# Patient Record
Sex: Female | Born: 1985 | Race: White | Hispanic: No | Marital: Single | Smoking: Current every day smoker
Health system: Southern US, Community
[De-identification: ages and names within clinical notes are randomized; demographics above are authoritative.]

## PROBLEM LIST (undated history)

## (undated) DIAGNOSIS — I10 Essential (primary) hypertension: Secondary | ICD-10-CM

## (undated) DIAGNOSIS — E7800 Pure hypercholesterolemia, unspecified: Secondary | ICD-10-CM

## (undated) HISTORY — DX: Pure hypercholesterolemia, unspecified: E78.00

## (undated) HISTORY — DX: Essential (primary) hypertension: I10

## (undated) HISTORY — PX: ABDOMINAL HYSTERECTOMY: SHX81

---

## 2019-07-15 ENCOUNTER — Emergency Department (HOSPITAL_COMMUNITY)
Admission: EM | Admit: 2019-07-15 | Discharge: 2019-07-15 | Disposition: A | Discharge status: Home / Self Care | Payer: HRSA Program | Attending: Emergency Medicine | Admitting: Emergency Medicine

## 2019-07-15 ENCOUNTER — Encounter (HOSPITAL_COMMUNITY): Payer: Self-pay | Admitting: Emergency Medicine

## 2019-07-15 ENCOUNTER — Other Ambulatory Visit: Payer: Self-pay

## 2019-07-15 DIAGNOSIS — F17210 Nicotine dependence, cigarettes, uncomplicated: Secondary | ICD-10-CM | POA: Diagnosis not present

## 2019-07-15 DIAGNOSIS — J019 Acute sinusitis, unspecified: Secondary | ICD-10-CM | POA: Insufficient documentation

## 2019-07-15 DIAGNOSIS — R519 Headache, unspecified: Secondary | ICD-10-CM | POA: Diagnosis present

## 2019-07-15 DIAGNOSIS — U071 COVID-19: Secondary | ICD-10-CM | POA: Insufficient documentation

## 2019-07-15 DIAGNOSIS — Z20822 Contact with and (suspected) exposure to covid-19: Secondary | ICD-10-CM

## 2019-07-15 DIAGNOSIS — Z20828 Contact with and (suspected) exposure to other viral communicable diseases: Secondary | ICD-10-CM

## 2019-07-15 LAB — URINALYSIS, ROUTINE W REFLEX MICROSCOPIC
Bilirubin Urine: NEGATIVE
Glucose, UA: NEGATIVE mg/dL
Hgb urine dipstick: NEGATIVE
Ketones, ur: NEGATIVE mg/dL
Nitrite: NEGATIVE
Protein, ur: NEGATIVE mg/dL
Specific Gravity, Urine: 1.021 (ref 1.005–1.030)
pH: 6 (ref 5.0–8.0)

## 2019-07-15 LAB — CBC WITH DIFFERENTIAL/PLATELET
Abs Immature Granulocytes: 0.01 10*3/uL (ref 0.00–0.07)
Basophils Absolute: 0 10*3/uL (ref 0.0–0.1)
Basophils Relative: 0 %
Eosinophils Absolute: 0.1 10*3/uL (ref 0.0–0.5)
Eosinophils Relative: 2 %
HCT: 44.7 % (ref 36.0–46.0)
Hemoglobin: 14.7 g/dL (ref 12.0–15.0)
Immature Granulocytes: 0 %
Lymphocytes Relative: 42 %
Lymphs Abs: 3.2 10*3/uL (ref 0.7–4.0)
MCH: 28.5 pg (ref 26.0–34.0)
MCHC: 32.9 g/dL (ref 30.0–36.0)
MCV: 86.8 fL (ref 80.0–100.0)
Monocytes Absolute: 0.3 10*3/uL (ref 0.1–1.0)
Monocytes Relative: 5 %
Neutro Abs: 4 10*3/uL (ref 1.7–7.7)
Neutrophils Relative %: 51 %
Platelets: 283 10*3/uL (ref 150–400)
RBC: 5.15 MIL/uL — ABNORMAL HIGH (ref 3.87–5.11)
RDW: 13.2 % (ref 11.5–15.5)
WBC: 7.6 10*3/uL (ref 4.0–10.5)
nRBC: 0 % (ref 0.0–0.2)

## 2019-07-15 LAB — COMPREHENSIVE METABOLIC PANEL
ALT: 54 U/L — ABNORMAL HIGH (ref 0–44)
AST: 44 U/L — ABNORMAL HIGH (ref 15–41)
Albumin: 4.1 g/dL (ref 3.5–5.0)
Alkaline Phosphatase: 49 U/L (ref 38–126)
Anion gap: 9 (ref 5–15)
BUN: 6 mg/dL (ref 6–20)
CO2: 28 mmol/L (ref 22–32)
Calcium: 9.3 mg/dL (ref 8.9–10.3)
Chloride: 102 mmol/L (ref 98–111)
Creatinine, Ser: 0.46 mg/dL (ref 0.44–1.00)
GFR calc Af Amer: >60 mL/min (ref >60)
GFR calc non Af Amer: >60 mL/min (ref >60)
Glucose, Bld: 89 mg/dL (ref 70–99)
Potassium: 4.3 mmol/L (ref 3.5–5.1)
Sodium: 139 mmol/L (ref 135–145)
Total Bilirubin: 0.4 mg/dL (ref 0.3–1.2)
Total Protein: 7.7 g/dL (ref 6.5–8.1)

## 2019-07-15 LAB — SARS CORONAVIRUS 2 (TAT 6-24 HRS): SARS Coronavirus 2: POSITIVE — AB

## 2019-07-15 LAB — PREGNANCY, URINE: Preg Test, Ur: NEGATIVE

## 2019-07-15 MED ORDER — ONDANSETRON HCL 4 MG/2ML IJ SOLN
4.0000 mg | Freq: Once | INTRAMUSCULAR | Status: AC
  Administered 2019-07-15: 13:00:00 4 mg via INTRAVENOUS
  Filled 2019-07-15: qty 2

## 2019-07-15 MED ORDER — AMOXICILLIN-POT CLAVULANATE 875-125 MG PO TABS: 1.0000 | ORAL_TABLET | Freq: Two times a day (BID) | ORAL | 0 refills | Status: DC

## 2019-07-15 MED ORDER — SODIUM CHLORIDE 0.9 % IV BOLUS
1000.0000 mL | Freq: Once | INTRAVENOUS | Status: AC
  Administered 2019-07-15: 1000 mL via INTRAVENOUS

## 2019-07-15 MED ORDER — ONDANSETRON HCL 4 MG PO TABS: 4.0000 mg | ORAL_TABLET | Freq: Three times a day (TID) | ORAL | 0 refills | Status: DC | PRN

## 2019-07-15 NOTE — Discharge Instructions (Signed)
You were seen in the emergency department for headache nasal congestion cough diarrhea.  You had blood work that was only significant for a mild elevation of your liver tests.  We are sending you home with a prescription for an antibiotic.  Your Covid testing was still pending at time of discharge.  Please follow-up with the results that in my chart.  You should isolate until your Covid testing is resulted and return to work when your symptoms are improved.  Please follow-up with your doctor and return if any worsening symptoms.

## 2019-07-15 NOTE — ED Triage Notes (Signed)
Sunday started with diarrhea for 24 hours.  Tuesday c/o SOB, no taste no smell, Runny nose and headache,  Rates headache 8/10.  Pt work in [employer].

## 2019-07-15 NOTE — ED Provider Notes (Signed)
[facility] EMERGENCY DEPARTMENT Provider Note   CSN: [id number] Arrival date & time: 07/15/19  1105     History   Chief Complaint Chief Complaint  Patient presents with  . Possible COVID    HPI Rachael Dixon is a 33 y.o. female.  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  She is complaining of 4 days of headache nasal congestion loss of smell loss of taste cough shortness of breath and intermittent diarrhea.  She has been taking Tylenol and Benadryl with minimal improvement and also tried a antidiarrhea medicine.  Does not think fever but has been running hot and cold.  Nausea no vomiting, no urinary symptoms.  No sick contacts but does work in [employer] treasure[employer].     The history is provided by the patient.  URI Presenting symptoms: congestion, cough, fatigue, rhinorrhea and sore throat   Cough:    Cough characteristics:  Non-productive   Severity:  Moderate   Onset quality:  Gradual   Timing:  Intermittent   Progression:  Unchanged   Chronicity:  New Associated symptoms: headaches, myalgias and sinus pain   Risk factors: no diabetes mellitus, no immunosuppression and no recent travel     History reviewed. No pertinent past medical history.  There are no active problems to display for this patient.   History reviewed. No pertinent surgical history.   OB History    Gravida  1   Para      Term      Preterm      AB      Living        SAB      TAB      Ectopic      Multiple      Live Births               Home Medications    Prior to Admission medications   Not on File    Family History History reviewed. No pertinent family history.  Social History Social History   Tobacco Use  . Smoking status: Current Every Day Smoker    Packs/day: 1.00    Types: Cigarettes  . Smokeless tobacco: Never Used  Substance Use Topics  . Alcohol use: Yes    Alcohol/week: 4.0 standard drinks    Types: 4 Shots of liquor per week    Comment: four per  year  . Drug use: Never     Allergies   Patient has no known allergies.   Review of Systems Review of Systems  Constitutional: Positive for fatigue.  HENT: Positive for congestion, rhinorrhea, sinus pain and sore throat.   Eyes: Negative for visual disturbance.  Respiratory: Positive for cough.   Cardiovascular: Negative for chest pain.  Gastrointestinal: Positive for diarrhea and nausea. Negative for vomiting.  Genitourinary: Negative for dysuria.  Musculoskeletal: Positive for myalgias.  Skin: Negative for rash.  Neurological: Positive for headaches.     Physical Exam Updated Vital Signs BP (!) 142/117 (BP Location: Right Arm)   Pulse 89   Temp 98.1 F (36.7 C) (Oral)   Resp 16   Ht 5\' 1"  (1.549 m)   Wt 84.8 kg   LMP 07/11/2019 (Exact Date)   SpO2 99%   BMI 35.33 kg/m   Physical Exam Vitals signs and nursing note reviewed.  Constitutional:      General: She is not in acute distress.    Appearance: She is well-developed.  HENT:     Head: Normocephalic and atraumatic.  Mouth/Throat:     Mouth: Mucous membranes are moist.     Pharynx: Oropharynx is clear.  Eyes:     Conjunctiva/sclera: Conjunctivae normal.  Neck:     Musculoskeletal: Neck supple.  Cardiovascular:     Rate and Rhythm: Normal rate and regular rhythm.     Heart sounds: No murmur.  Pulmonary:     Effort: Pulmonary effort is normal. No respiratory distress.     Breath sounds: Normal breath sounds.  Abdominal:     Palpations: Abdomen is soft.     Tenderness: There is no abdominal tenderness. There is no guarding or rebound.  Musculoskeletal: Normal range of motion.     Right lower leg: No edema.     Left lower leg: No edema.  Skin:    General: Skin is warm and dry.     Capillary Refill: Capillary refill takes less than 2 seconds.  Neurological:     General: No focal deficit present.     Mental Status: She is alert.      ED Treatments / Results  Labs (all labs ordered are listed,  but only abnormal results are displayed) Labs Reviewed  COMPREHENSIVE METABOLIC PANEL - Abnormal; Notable for the following components:      Result Value   AST 44 (*)    ALT 54 (*)    All other components within normal limits  CBC WITH DIFFERENTIAL/PLATELET - Abnormal; Notable for the following components:   RBC 5.15 (*)    All other components within normal limits  URINALYSIS, ROUTINE W REFLEX MICROSCOPIC - Abnormal; Notable for the following components:   APPearance HAZY (*)    Leukocytes,Ua SMALL (*)    Bacteria, UA RARE (*)    All other components within normal limits  SARS CORONAVIRUS 2 (TAT 6-24 HRS)  PREGNANCY, URINE    EKG None  Radiology No results found.  Procedures Procedures (including critical care time)  Medications Ordered in ED Medications  sodium chloride 0.9 % bolus 1,000 mL (0 mLs Intravenous Stopped 07/15/19 1413)  ondansetron (ZOFRAN) injection 4 mg (4 mg Intravenous Given 07/15/19 1302)     Initial Impression / Assessment and Plan / ED Course  I have reviewed the triage vital signs and the nursing notes.  Pertinent labs & imaging results that were available during my care of the patient were reviewed by me and considered in my medical decision making (see chart for details).  Clinical Course as of Jul 14 1648  Fri Jul 15, 2019  1411 Patient here with feeling hot and cold headache nasal congestion cough and some diarrhea.  Likely viral syndrome possibly Covid possibly sinusitis.  She is nontoxic-appearing although looks tired.  Vitals here are unremarkable other than hypertension.  Lungs are clear.  No elevated white count.  AST and ALT mildly elevated which I mentioned to her.  We did Covid testing because of her employment and health care.  She does not wish to wait for the Covid result.  I will give her a prescription for antibiotic for possible sinus infection.  She understands to isolate and wait for the results of her testing.  Clear return  instructions given.   [MB]    Clinical Course User Index [MB] Terrilee Files, MD   Rachael Dixon was evaluated in Emergency Department on 07/15/2019 for the symptoms described in the history of present illness. She was evaluated in the context of the global COVID-19 pandemic, which necessitated consideration that the patient might be at  risk for infection with the SARS-CoV-2 virus that causes COVID-19. Institutional protocols and algorithms that pertain to the evaluation of patients at risk for COVID-19 are in a state of rapid change based on information released by regulatory bodies including the CDC and federal and state organizations. These policies and algorithms were followed during the patient's care in the ED.     Final Clinical Impressions(s) / ED Diagnoses   Final diagnoses:  Acute sinusitis, recurrence not specified, unspecified location  Person under investigation for COVID-19    ED Discharge Orders    None       Terrilee FilesButler, Avyana Puffenbarger C, MD 07/15/19 1650

## 2019-07-25 ENCOUNTER — Emergency Department (HOSPITAL_COMMUNITY): Payer: HRSA Program

## 2019-07-25 ENCOUNTER — Emergency Department (HOSPITAL_COMMUNITY)
Admission: EM | Admit: 2019-07-25 | Discharge: 2019-07-25 | Disposition: A | Discharge status: Home / Self Care | Payer: HRSA Program | Attending: Emergency Medicine | Admitting: Emergency Medicine

## 2019-07-25 ENCOUNTER — Other Ambulatory Visit: Payer: Self-pay

## 2019-07-25 ENCOUNTER — Encounter (HOSPITAL_COMMUNITY): Payer: Self-pay | Admitting: Emergency Medicine

## 2019-07-25 DIAGNOSIS — R519 Headache, unspecified: Secondary | ICD-10-CM | POA: Diagnosis present

## 2019-07-25 DIAGNOSIS — Z79899 Other long term (current) drug therapy: Secondary | ICD-10-CM | POA: Insufficient documentation

## 2019-07-25 DIAGNOSIS — R109 Unspecified abdominal pain: Secondary | ICD-10-CM | POA: Diagnosis not present

## 2019-07-25 DIAGNOSIS — U071 COVID-19: Secondary | ICD-10-CM

## 2019-07-25 DIAGNOSIS — F17210 Nicotine dependence, cigarettes, uncomplicated: Secondary | ICD-10-CM | POA: Diagnosis not present

## 2019-07-25 LAB — CBC WITH DIFFERENTIAL/PLATELET
Abs Immature Granulocytes: 0.05 10*3/uL (ref 0.00–0.07)
Basophils Absolute: 0 10*3/uL (ref 0.0–0.1)
Basophils Relative: 0 %
Eosinophils Absolute: 0.2 10*3/uL (ref 0.0–0.5)
Eosinophils Relative: 1 %
HCT: 43.4 % (ref 36.0–46.0)
Hemoglobin: 13.9 g/dL (ref 12.0–15.0)
Immature Granulocytes: 0 %
Lymphocytes Relative: 30 %
Lymphs Abs: 3.7 10*3/uL (ref 0.7–4.0)
MCH: 28.1 pg (ref 26.0–34.0)
MCHC: 32 g/dL (ref 30.0–36.0)
MCV: 87.7 fL (ref 80.0–100.0)
Monocytes Absolute: 0.6 10*3/uL (ref 0.1–1.0)
Monocytes Relative: 5 %
Neutro Abs: 7.7 10*3/uL (ref 1.7–7.7)
Neutrophils Relative %: 64 %
Platelets: 268 10*3/uL (ref 150–400)
RBC: 4.95 MIL/uL (ref 3.87–5.11)
RDW: 13.3 % (ref 11.5–15.5)
WBC: 12.3 10*3/uL — ABNORMAL HIGH (ref 4.0–10.5)
nRBC: 0 % (ref 0.0–0.2)

## 2019-07-25 LAB — COMPREHENSIVE METABOLIC PANEL
ALT: 41 U/L (ref 0–44)
AST: 32 U/L (ref 15–41)
Albumin: 4 g/dL (ref 3.5–5.0)
Alkaline Phosphatase: 44 U/L (ref 38–126)
Anion gap: 9 (ref 5–15)
BUN: 10 mg/dL (ref 6–20)
CO2: 27 mmol/L (ref 22–32)
Calcium: 8.9 mg/dL (ref 8.9–10.3)
Chloride: 99 mmol/L (ref 98–111)
Creatinine, Ser: 0.49 mg/dL (ref 0.44–1.00)
GFR calc Af Amer: >60 mL/min (ref >60)
GFR calc non Af Amer: >60 mL/min (ref >60)
Glucose, Bld: 81 mg/dL (ref 70–99)
Potassium: 3.7 mmol/L (ref 3.5–5.1)
Sodium: 135 mmol/L (ref 135–145)
Total Bilirubin: 0.3 mg/dL (ref 0.3–1.2)
Total Protein: 7.3 g/dL (ref 6.5–8.1)

## 2019-07-25 LAB — URINALYSIS, ROUTINE W REFLEX MICROSCOPIC
Bilirubin Urine: NEGATIVE
Glucose, UA: NEGATIVE mg/dL
Ketones, ur: NEGATIVE mg/dL
Nitrite: NEGATIVE
Protein, ur: NEGATIVE mg/dL
Specific Gravity, Urine: 1.027 (ref 1.005–1.030)
pH: 5 (ref 5.0–8.0)

## 2019-07-25 LAB — PREGNANCY, URINE: Preg Test, Ur: NEGATIVE

## 2019-07-25 LAB — LIPASE, BLOOD: Lipase: 23 U/L (ref 11–51)

## 2019-07-25 MED ORDER — METOCLOPRAMIDE HCL 5 MG/ML IJ SOLN
10.0000 mg | Freq: Once | INTRAMUSCULAR | Status: AC
  Administered 2019-07-25: 16:00:00 10 mg via INTRAVENOUS
  Filled 2019-07-25: qty 2

## 2019-07-25 MED ORDER — CEPHALEXIN 500 MG PO CAPS: 500.0000 mg | ORAL_CAPSULE | Freq: Two times a day (BID) | ORAL | 0 refills | Status: DC

## 2019-07-25 MED ORDER — DIPHENHYDRAMINE HCL 50 MG/ML IJ SOLN
12.5000 mg | Freq: Once | INTRAMUSCULAR | Status: AC
  Administered 2019-07-25: 12.5 mg via INTRAVENOUS
  Filled 2019-07-25: qty 1

## 2019-07-25 MED ORDER — IOHEXOL 300 MG/ML  SOLN
100.0000 mL | Freq: Once | INTRAMUSCULAR | Status: AC | PRN
  Administered 2019-07-25: 18:00:00 100 mL via INTRAVENOUS

## 2019-07-25 MED ORDER — SODIUM CHLORIDE 0.9 % IV BOLUS
1000.0000 mL | Freq: Once | INTRAVENOUS | Status: AC
  Administered 2019-07-25: 1000 mL via INTRAVENOUS

## 2019-07-25 MED ORDER — ONDANSETRON 4 MG PO TBDP: 4.0000 mg | ORAL_TABLET | Freq: Three times a day (TID) | ORAL | 0 refills | Status: DC | PRN

## 2019-07-25 MED ORDER — DEXAMETHASONE SODIUM PHOSPHATE 10 MG/ML IJ SOLN
10.0000 mg | Freq: Once | INTRAMUSCULAR | Status: AC
  Administered 2019-07-25: 19:00:00 10 mg via INTRAVENOUS
  Filled 2019-07-25: qty 1

## 2019-07-25 NOTE — ED Notes (Signed)
Pt returned from CT °

## 2019-07-25 NOTE — ED Notes (Signed)
Patient transported to CT 

## 2019-07-25 NOTE — Discharge Instructions (Addendum)
You were seen in the emergency department today for headache, abdominal pain, and diarrhea. Your work-up was overall reassuring. Your CT scan did not show any major abnormalities.  It did show some prominent follicles around your ovaries and some fatty liver changes which can be discussed with your primary care provider for follow up.   Your urine looks somewhat concerning for infection there with for we are sending home with Keflex to take for a UTI.  We are also sending home with Zofran to take as needed for nausea/vomiting.  Please follow attached diet guidelines to help with diarrhea.  We have prescribed you new medication(s) today. Discuss the medications prescribed today with your pharmacist as they can have adverse effects and interactions with your other medicines including over the counter and prescribed medications. Seek medical evaluation if you start to experience new or abnormal symptoms after taking one of these medicines, seek care immediately if you start to experience difficulty breathing, feeling of your throat closing, facial swelling, or rash as these could be indications of a more serious allergic reaction  Please continue to take over-the-counter Tylenol/Motrin to help with discomfort.  Please follow-up with primary care within 3 days for reevaluation.  Return to the ER for new or worsening symptoms including but not limited to inability keep fluids down, trouble breathing, blood in vomit, dark/tarry stools, passing out, numbness, weakness, worsening pain, or any other concerns.

## 2019-07-25 NOTE — ED Provider Notes (Signed)
[facility] EMERGENCY DEPARTMENT Provider Note   CSN: [id number] Arrival date & time: 07/25/19  1203     History   Chief Complaint Chief Complaint  Patient presents with   Headache    COVID +    HPI Rachael Dixon is a 33 y.o. female with a hx of tobacco abuse who presents to the ED with complaints of headache, N/V/D, & abdominal discomfort. Patient states she has felt poorly for the past 10 days, diagnosed w/ covid 07/15/19. States initially was more having cough, body aches & diarrhea. Now she is more fatigue, having headaches (generaized, gradual onset, steady progression, 8/10 currently), has had some nausea/vomiting, as well as abdominal pain with continuation of her prior sxs. No alleviating/aggravating factors. Trying tylenol w/o much relief. Denies fever, dyspnea, hematemesis, melena, dysuria, visual disturbance, numbness, weakness, or dizziness.    HPI  History reviewed. No pertinent past medical history.  There are no active problems to display for this patient.   History reviewed. No pertinent surgical history.   OB History    Gravida  1   Para      Term      Preterm      AB      Living        SAB      TAB      Ectopic      Multiple      Live Births               Home Medications    Prior to Admission medications   Medication Sig Start Date End Date Taking? Authorizing Provider  amoxicillin-clavulanate (AUGMENTIN) 875-125 MG tablet Take 1 tablet by mouth every 12 (twelve) hours. 07/15/19   Terrilee Files, MD  APRI 0.15-30 MG-MCG tablet Take 1 tablet by mouth daily. 06/07/19   [provider]  ondansetron (ZOFRAN) 4 MG tablet Take 1 tablet (4 mg total) by mouth every 8 (eight) hours as needed for nausea or vomiting. 07/15/19   Terrilee Files, MD    Family History No family history on file.  Social History Social History   Tobacco Use   Smoking status: Current Every Day Smoker    Packs/day: 1.00    Types: Cigarettes     Smokeless tobacco: Never Used  Substance Use Topics   Alcohol use: Yes    Alcohol/week: 4.0 standard drinks    Types: 4 Shots of liquor per week    Comment: four per year   Drug use: Never     Allergies   Patient has no known allergies.   Review of Systems Review of Systems  Constitutional: Positive for chills and fatigue. Negative for fever.  HENT: Positive for ear pain. Negative for congestion and sore throat.   Respiratory: Positive for cough. Negative for shortness of breath.   Gastrointestinal: Positive for abdominal pain, diarrhea, nausea and vomiting.  Neurological: Positive for weakness (generalized) and headaches. Negative for dizziness, seizures, syncope, facial asymmetry, speech difficulty and numbness.  All other systems reviewed and are negative.   Physical Exam Updated Vital Signs BP (!) 134/102 (BP Location: Right Arm)    Pulse 99    Temp 98.3 F (36.8 C) (Oral)    Resp 18    Ht  (1.575 m)    Wt 84.8 kg    LMP 07/11/2019 (Exact Date)    SpO2 95%    BMI 34.20 kg/m   Physical Exam Vitals signs and nursing note reviewed.  Constitutional:  General: She is not in acute distress.    Appearance: She is well-developed.  HENT:     Head: Normocephalic and atraumatic.     Right Ear: Ear canal normal. Tympanic membrane is not perforated, erythematous, retracted or bulging.     Left Ear: Ear canal normal. Tympanic membrane is not perforated, erythematous, retracted or bulging.     Ears:     Comments: No mastoid erythema/swelling/tenderness.     Nose:     Right Sinus: No maxillary sinus tenderness or frontal sinus tenderness.     Left Sinus: No maxillary sinus tenderness or frontal sinus tenderness.     Mouth/Throat:     Pharynx: Uvula midline. No oropharyngeal exudate or posterior oropharyngeal erythema.     Comments: Posterior oropharynx is symmetric appearing. Patient tolerating own secretions without difficulty. No trismus. No drooling. No hot potato  voice. No swelling beneath the tongue, submandibular compartment is soft.  Eyes:     General:        Right eye: No discharge.        Left eye: No discharge.     Conjunctiva/sclera: Conjunctivae normal.     Pupils: Pupils are equal, round, and reactive to light.  Neck:     Musculoskeletal: Normal range of motion and neck supple. No edema or neck rigidity.  Cardiovascular:     Rate and Rhythm: Normal rate and regular rhythm.     Heart sounds: No murmur.  Pulmonary:     Effort: Pulmonary effort is normal. No respiratory distress.     Breath sounds: Normal breath sounds. No wheezing, rhonchi or rales.  Abdominal:     General: There is no distension.     Palpations: Abdomen is soft.     Tenderness: There is abdominal tenderness in the right lower quadrant and left lower quadrant. There is no guarding or rebound.  Lymphadenopathy:     Cervical: No cervical adenopathy.  Skin:    General: Skin is warm and dry.     Findings: No rash.  Neurological:     Mental Status: She is alert.     Comments: Alert. Clear speech. No facial droop. CNIII-XII grossly intact. Bilateral upper and lower extremities' sensation grossly intact. 5/5 symmetric strength with grip strength and with plantar and dorsi flexion bilaterally. Normal finger to nose bilaterally. Negative pronator drift. Gait is steady and intact   Psychiatric:        Behavior: Behavior normal.    ED Treatments / Results  Labs (all labs ordered are listed, but only abnormal results are displayed) Labs Reviewed  CBC WITH DIFFERENTIAL/PLATELET - Abnormal; Notable for the following components:      Result Value   WBC 12.3 (*)    All other components within normal limits  URINALYSIS, ROUTINE W REFLEX MICROSCOPIC - Abnormal; Notable for the following components:   APPearance HAZY (*)    Hgb urine dipstick MODERATE (*)    Leukocytes,Ua LARGE (*)    Bacteria, UA RARE (*)    All other components within normal limits  COMPREHENSIVE METABOLIC  PANEL  LIPASE, BLOOD  PREGNANCY, URINE    EKG None  Radiology Ct Abdomen Pelvis W Contrast  Result Date: 07/25/2019 CLINICAL DATA:  Abdominal pain.  Recent COVID-19 positive EXAM: CT ABDOMEN AND PELVIS WITH CONTRAST TECHNIQUE: Multidetector CT imaging of the abdomen and pelvis was performed using the standard protocol following bolus administration of intravenous contrast. CONTRAST:  115mL OMNIPAQUE IOHEXOL 300 MG/ML  SOLN COMPARISON:  January 07, 2016  FINDINGS: Lower chest: Lung bases are clear. Hepatobiliary: There is hepatic steatosis. Liver measures 21.0 cm in length. No focal liver lesions are demonstrable. The gallbladder wall is not appreciably thickened. There is no biliary duct dilatation. Pancreas: There is no pancreatic mass or inflammatory focus. Spleen: No splenic lesions are evident. Adrenals/Urinary Tract: Adrenals bilaterally appear unremarkable. Kidneys bilaterally show no evident mass or hydronephrosis on either side. There is a 3 x 2 mm calculus in the lower pole of the left kidney. There are two 1 mm nearby calculi in the lower pole left kidney. There is no evident ureteral calculus on either side. Urinary bladder is midline with wall thickness within normal limits. Stomach/Bowel: There is no appreciable bowel wall or mesenteric thickening. Terminal ileum appears unremarkable. No bowel obstruction. There is no free air or portal venous air. Vascular/Lymphatic: There is no abdominal aortic aneurysm. No evident vascular lesions. There is a circumaortic left renal vein, an anatomic variant. There is no adenopathy in the abdomen or pelvis. Reproductive: Uterus is anteverted. There are follicles in each ovary. There is a cystic area in the left ovary measuring 3.2 x 3.0 cm. Beyond likely follicles, there is no extrauterine pelvic mass. Other: Appendix appears normal. No evident abscess or ascites in the abdomen or pelvis. There is mild fat in the umbilicus. Musculoskeletal: There are no  blastic or lytic bone lesions. There is no intramuscular or abdominal wall lesion. IMPRESSION: 1. Nonobstructing calculi in the left kidney. No ureteral calculi or hydronephrosis on either side. Urinary bladder wall thickness normal. 2. No bowel wall thickening or bowel obstruction. No abscess in the abdomen or pelvis. Appendix region appears normal. 3.  Prominent liver with hepatic steatosis. 4. Follicles in each ovary. Apparent dominant follicle measuring 3.2 x 3.0 cm in the left ovary. 5.  Visualized lung bases clear. Electronically Signed   By: Bretta Bang III M.D.   On: 07/25/2019 18:15    Procedures Procedures (including critical care time)  Medications Ordered in ED Medications - No data to display   Initial Impression / Assessment and Plan / ED Course  I have reviewed the triage vital signs and the nursing notes.  Pertinent labs & imaging results that were available during my care of the patient were reviewed by me and considered in my medical decision making (see chart for details).   Patient with known covid 19 presents to the ED w/ complaints of headache, continued diarrhea, & abdominal pain. Patient nontoxic appearing, vitals without significant abnormality. Exam w/ lower abdominal tenderness w/o peritoneal signs. Patient has hx of similar headaches, gradual onset with steady progression in severity, no focal neuro deficits, no visual disturbance, no nuchal rigidity, no fevers- non concerning for [facility], ICH, ischemic CVA, dural venous sinus thrombosis, acute glaucoma, giant cell arteritis, mass, or meningitis. plan for labs & CT A/P.  CBC: Mild leukocytosis. No anemia.  CMP: Renal function & LFTs WNL.  Lipase: WNL preg test: Negative UA: Hematuria, leukocytes present, contaminated some--> re-discussed w/ patient & she is now endorsing some urgency/frequency- feel it is reasonable to cover with keflex.  CT A/P: Intra-renal stone, no uretereral calculi.  No bowel wall thickening  or bowel obstruction. No abscess in the abdomen or pelvis. Appendix region appears normal. Fatty liver changes. Ovarian follicles.   Discussed option of pelvic, patient declined which I am in agreement with.  Suspect sxs generally related to covid. Remains without peritoneal signs on abdominal exam. She states she is feeling a bit better  s/p migraine cocktail in the ED. Will give decadron to help with rebound. Keflex prescription to go home with to cover for UTI given her reported urinary sxs on re-assessment, urine culture sent. Zofran prescription to take as needed. Diarrhea diet recommendations. I discussed results, treatment plan, need for follow-up, and return precautions with the patient. Provided opportunity for questions, patient confirmed understanding and is in agreement with plan.   Lake Bellsiffany Lucking was evaluated in Emergency Department on 07/25/2019 for the symptoms described in the history of present illness. He/she was evaluated in the context of the global COVID-19 pandemic, which necessitated consideration that the patient might be at risk for infection with the SARS-CoV-2 virus that causes COVID-19. Institutional protocols and algorithms that pertain to the evaluation of patients at risk for COVID-19 are in a state of rapid change based on information released by regulatory bodies including the CDC and federal and state organizations. These policies and algorithms were followed during the patient's care in the ED.    Final Clinical Impressions(s) / ED Diagnoses   Final diagnoses:  COVID-19    ED Discharge Orders         Ordered    cephALEXin (KEFLEX) 500 MG capsule  2 times daily     07/25/19 1851    ondansetron (ZOFRAN ODT) 4 MG disintegrating tablet  Every 8 hours PRN     07/25/19 1854           Cherly Andersonetrucelli, Andrew Soria R, PA-C 07/25/19 Dian [id number]    Zammit, Joseph, MD 07/30/19 1040

## 2019-07-25 NOTE — ED Notes (Signed)
ED Provider at bedside. 

## 2019-07-25 NOTE — ED Triage Notes (Signed)
DX with Covid 10/02.  Headache, diarrhea and itching.

## 2019-07-27 LAB — URINE CULTURE: Culture: NO GROWTH

## 2019-07-28 ENCOUNTER — Other Ambulatory Visit: Payer: Self-pay

## 2019-07-28 DIAGNOSIS — Z20822 Contact with and (suspected) exposure to covid-19: Secondary | ICD-10-CM

## 2019-07-29 LAB — NOVEL CORONAVIRUS, NAA: SARS-CoV-2, NAA: NOT DETECTED

## 2020-01-11 ENCOUNTER — Encounter (HOSPITAL_COMMUNITY): Payer: Self-pay | Admitting: Emergency Medicine

## 2020-01-11 ENCOUNTER — Emergency Department (HOSPITAL_COMMUNITY): Payer: Self-pay

## 2020-01-11 ENCOUNTER — Emergency Department (HOSPITAL_COMMUNITY)
Admission: EM | Admit: 2020-01-11 | Discharge: 2020-01-11 | Disposition: A | Discharge status: Home / Self Care | Payer: Self-pay | Attending: Emergency Medicine | Admitting: Emergency Medicine

## 2020-01-11 ENCOUNTER — Other Ambulatory Visit: Payer: Self-pay

## 2020-01-11 DIAGNOSIS — Z91040 Latex allergy status: Secondary | ICD-10-CM | POA: Insufficient documentation

## 2020-01-11 DIAGNOSIS — N83202 Unspecified ovarian cyst, left side: Secondary | ICD-10-CM | POA: Insufficient documentation

## 2020-01-11 DIAGNOSIS — F17210 Nicotine dependence, cigarettes, uncomplicated: Secondary | ICD-10-CM | POA: Insufficient documentation

## 2020-01-11 DIAGNOSIS — R109 Unspecified abdominal pain: Secondary | ICD-10-CM | POA: Insufficient documentation

## 2020-01-11 DIAGNOSIS — N83209 Unspecified ovarian cyst, unspecified side: Secondary | ICD-10-CM

## 2020-01-11 DIAGNOSIS — Z79899 Other long term (current) drug therapy: Secondary | ICD-10-CM | POA: Insufficient documentation

## 2020-01-11 DIAGNOSIS — R102 Pelvic and perineal pain: Secondary | ICD-10-CM

## 2020-01-11 LAB — CBC WITH DIFFERENTIAL/PLATELET
Abs Immature Granulocytes: 0.02 10*3/uL (ref 0.00–0.07)
Basophils Absolute: 0.1 10*3/uL (ref 0.0–0.1)
Basophils Relative: 1 %
Eosinophils Absolute: 0.2 10*3/uL (ref 0.0–0.5)
Eosinophils Relative: 2 %
HCT: 40 % (ref 36.0–46.0)
Hemoglobin: 13 g/dL (ref 12.0–15.0)
Immature Granulocytes: 0 %
Lymphocytes Relative: 26 %
Lymphs Abs: 2.7 10*3/uL (ref 0.7–4.0)
MCH: 28.6 pg (ref 26.0–34.0)
MCHC: 32.5 g/dL (ref 30.0–36.0)
MCV: 87.9 fL (ref 80.0–100.0)
Monocytes Absolute: 0.4 10*3/uL (ref 0.1–1.0)
Monocytes Relative: 4 %
Neutro Abs: 7 10*3/uL (ref 1.7–7.7)
Neutrophils Relative %: 67 %
Platelets: 257 10*3/uL (ref 150–400)
RBC: 4.55 MIL/uL (ref 3.87–5.11)
RDW: 13.3 % (ref 11.5–15.5)
WBC: 10.4 10*3/uL (ref 4.0–10.5)
nRBC: 0 % (ref 0.0–0.2)

## 2020-01-11 LAB — URINALYSIS, ROUTINE W REFLEX MICROSCOPIC
Bilirubin Urine: NEGATIVE
Glucose, UA: NEGATIVE mg/dL
Hgb urine dipstick: NEGATIVE
Ketones, ur: NEGATIVE mg/dL
Nitrite: NEGATIVE
Protein, ur: NEGATIVE mg/dL
Specific Gravity, Urine: 1.021 (ref 1.005–1.030)
pH: 5 (ref 5.0–8.0)

## 2020-01-11 LAB — BASIC METABOLIC PANEL
Anion gap: 10 (ref 5–15)
BUN: 10 mg/dL (ref 6–20)
CO2: 24 mmol/L (ref 22–32)
Calcium: 9.1 mg/dL (ref 8.9–10.3)
Chloride: 104 mmol/L (ref 98–111)
Creatinine, Ser: 0.49 mg/dL (ref 0.44–1.00)
GFR calc Af Amer: >60 mL/min (ref >60)
GFR calc non Af Amer: >60 mL/min (ref >60)
Glucose, Bld: 91 mg/dL (ref 70–99)
Potassium: 4 mmol/L (ref 3.5–5.1)
Sodium: 138 mmol/L (ref 135–145)

## 2020-01-11 LAB — PREGNANCY, URINE: Preg Test, Ur: NEGATIVE

## 2020-01-11 MED ORDER — MORPHINE SULFATE (PF) 4 MG/ML IV SOLN
4.0000 mg | Freq: Once | INTRAVENOUS | Status: AC
  Administered 2020-01-11: 10:00:00 4 mg via INTRAVENOUS
  Filled 2020-01-11: qty 1

## 2020-01-11 MED ORDER — ONDANSETRON HCL 4 MG/2ML IJ SOLN
4.0000 mg | Freq: Once | INTRAMUSCULAR | Status: AC
  Administered 2020-01-11: 10:00:00 4 mg via INTRAVENOUS
  Filled 2020-01-11: qty 2

## 2020-01-11 MED ORDER — SODIUM CHLORIDE 0.9 % IV BOLUS
1000.0000 mL | Freq: Once | INTRAVENOUS | Status: AC
  Administered 2020-01-11: 1000 mL via INTRAVENOUS

## 2020-01-11 MED ORDER — OXYCODONE-ACETAMINOPHEN 5-325 MG PO TABS
1.0000 | ORAL_TABLET | Freq: Once | ORAL | Status: DC
  Filled 2020-01-11: qty 1

## 2020-01-11 MED ORDER — OXYCODONE-ACETAMINOPHEN 5-325 MG PO TABS: 1.0000 | ORAL_TABLET | Freq: Four times a day (QID) | ORAL | 0 refills | Status: DC | PRN

## 2020-01-11 NOTE — Discharge Instructions (Signed)
You were seen in the emergency department for left flank low back and low abdominal pain.  You had blood work urinalysis and a CAT scan that did not show any evidence of a kidney stone.  The CAT scan and the ultrasound showed evidence of a complex ovarian cyst on the left possibly hemorrhagic.  This will need follow-up with your gynecologist and a repeat ultrasound to make sure it resolves.  You can use ibuprofen as needed for pain and we are prescribing some narcotic pain medicine for more severe pain.  Return to the emergency department if any acute worsening symptoms.

## 2020-01-11 NOTE — ED Triage Notes (Signed)
Patient is having left flank pain, radiating to back and lower groin area, no urinary symptoms, has history of kidney stones.

## 2020-01-11 NOTE — ED Provider Notes (Signed)
[facility] Provider Note   CSN: [id number] Arrival date & time: 01/11/20  7124     History Chief Complaint  Patient presents with  . Flank Pain    Left flank pain, radiating to back and lower groin.    [facility] is a 34 y.o. female.  She has a prior history of kidney stones.  She said [place] the [place]ampoline park [place] and has had back soreness since then.  This acutely worsened this morning with pain in her left flank and left low back radiating into her left lower quadrant.  Associated with some urinary frequency.  Feels similar to prior kidney stone.  No fevers or chills.  Does have associated nausea but no vomiting.  No hematuria.  No numbness or weakness.  The history is provided by the patient.  Flank Pain This is a new problem. The current episode started 3 to 5 hours ago. The problem occurs constantly. The problem has not changed since onset.Associated symptoms include abdominal pain. Pertinent negatives include no chest pain, no headaches and no shortness of breath. Nothing aggravates the symptoms. Nothing relieves the symptoms. She has tried nothing for the symptoms. The treatment provided no relief.       No past medical history on file.  There are no problems to display for this patient.   No past surgical history on file.   OB History    Gravida  1   Para      Term      Preterm      AB      Living        SAB      TAB      Ectopic      Multiple      Live Births              History reviewed. No pertinent family history.  Social History   Tobacco Use  . Smoking status: Current Every Day Smoker    Packs/day: 1.00    Types: Cigarettes  . Smokeless tobacco: Never Used  Substance Use Topics  . Alcohol use: Yes    Alcohol/week: 4.0 standard drinks    Types: 4 Shots of liquor per week    Comment: four per year  . Drug use: Never    Home Medications Prior to Admission medications   Medication Sig  Start Date End Date Taking? Authorizing Provider  acetaminophen (TYLENOL) 500 MG tablet Take 500 mg by mouth every 6 (six) hours as needed for mild pain or moderate pain.    [provider]  amoxicillin-clavulanate (AUGMENTIN) 875-125 MG tablet Take 1 tablet by mouth every 12 (twelve) hours. Patient not taking: Reported on 07/25/2019 07/15/19   Terrilee Files, MD  APRI 0.15-30 MG-MCG tablet Take 1 tablet by mouth daily. 06/07/19   [provider]  cephALEXin (KEFLEX) 500 MG capsule Take 1 capsule (500 mg total) by mouth 2 (two) times daily. 07/25/19   Petrucelli, Samantha R, PA-C  ondansetron (ZOFRAN ODT) 4 MG disintegrating tablet Take 1 tablet (4 mg total) by mouth every 8 (eight) hours as needed for nausea or vomiting. 07/25/19   Petrucelli, Samantha R, PA-C  ondansetron (ZOFRAN) 4 MG tablet Take 1 tablet (4 mg total) by mouth every 8 (eight) hours as needed for nausea or vomiting. 07/15/19   Terrilee Files, MD    Allergies    Latex  Review of Systems   Review of Systems  Constitutional: Negative for fever.  HENT: Negative for sore throat.   Eyes: Negative for visual disturbance.  Respiratory: Negative for shortness of breath.   Cardiovascular: Negative for chest pain.  Gastrointestinal: Positive for abdominal pain and nausea. Negative for vomiting.  Genitourinary: Positive for flank pain and frequency. Negative for dysuria and hematuria.  Musculoskeletal: Positive for back pain.  Skin: Negative for rash.  Neurological: Negative for headaches.    Physical Exam Updated Vital Signs BP (!) 129/92 (BP Location: Left Arm)   Pulse 90   Temp 98 F (36.7 C) (Oral)   Resp 18   Ht 5\' 1"  (1.549 m)   Wt 83.9 kg   LMP 12/21/2019   SpO2 99%   BMI 34.96 kg/m   Physical Exam Vitals and nursing note reviewed.  Constitutional:      General: She is not in acute distress.    Appearance: She is well-developed.  HENT:     Head: Normocephalic and atraumatic.  Eyes:      Conjunctiva/sclera: Conjunctivae normal.  Cardiovascular:     Rate and Rhythm: Normal rate and regular rhythm.     Heart sounds: No murmur.  Pulmonary:     Effort: Pulmonary effort is normal. No respiratory distress.     Breath sounds: Normal breath sounds.  Abdominal:     Palpations: Abdomen is soft.     Tenderness: There is no abdominal tenderness. There is right CVA tenderness and left CVA tenderness.  Musculoskeletal:        General: Tenderness present. No deformity. Normal range of motion.     Cervical back: Neck supple.     Comments: Bilateral CVA tenderness and diffuse lower back tenderness.  Skin:    General: Skin is warm and dry.     Capillary Refill: Capillary refill takes less than 2 seconds.  Neurological:     General: No focal deficit present.     Mental Status: She is alert.     Sensory: No sensory deficit.     Motor: No weakness.     Gait: Gait normal.     ED Results / Procedures / Treatments   Labs (all labs ordered are listed, but only abnormal results are displayed) Labs Reviewed  URINALYSIS, ROUTINE W REFLEX MICROSCOPIC - Abnormal; Notable for the following components:      Result Value   Leukocytes,Ua TRACE (*)    Bacteria, UA RARE (*)    All other components within normal limits  BASIC METABOLIC PANEL  CBC WITH DIFFERENTIAL/PLATELET  PREGNANCY, URINE    EKG None  Radiology CT Renal Stone Study  Result Date: 01/11/2020 CLINICAL DATA:  Left flank back pain EXAM: CT ABDOMEN AND PELVIS WITHOUT CONTRAST TECHNIQUE: Multidetector CT imaging of the abdomen and pelvis was performed following the standard protocol without oral or IV contrast. COMPARISON:  July 25, 2019 FINDINGS: Lower chest: Lung bases are clear. Hepatobiliary: Liver measures 21.8 cm in length. There is hepatic steatosis. No focal liver lesions are evident on this noncontrast enhanced study. Gallbladder wall is not appreciably thickened. There is no biliary duct dilatation. Pancreas:  There is no pancreatic mass or inflammatory focus. Spleen: No splenic lesions are evident. Adrenals/Urinary Tract: Adrenals bilaterally appear normal. There is no appreciable renal mass or hydronephrosis on either side. There is a 1 mm calculus, nonobstructing, in the lower pole the right kidney. There are several 1-3 mm calculi in the lower pole the left kidney, nonobstructing. There is no appreciable ureteral calculus on either side. Urinary  bladder is midline with wall thickness within normal limits. Stomach/Bowel: There is no appreciable bowel wall or mesenteric thickening. Terminal ileum appears normal. There is no evident bowel obstruction. There is no appreciable free air or portal venous air. Vascular/Lymphatic: No abdominal aortic aneurysm. No vascular lesions evident. There is a circumaortic left renal vein, an anatomic variant. There is no evident adenopathy in the abdomen or pelvis. Reproductive: Uterus is anteverted. There is a cystic left ovarian/adnexal mass measuring 5.2 x 4.3 x 3.4 cm. There is minimal calcification in a portion of the wall of this cystic mass lesion on the left. No other pelvic masses are evident. Other: The appendix appears normal. No evident abscess or ascites in the abdomen or pelvis. There is slight fat in the umbilicus. Musculoskeletal: There are no blastic or lytic bone lesions. No intramuscular lesions are evident. IMPRESSION: 1. Cystic left ovarian/adnexal mass measuring 5.2 x 4.3 x 3.4 cm. There is minimal thin calcification in the wall of this mass. Suspect simple ovarian cyst, although ovarian cystadenoma could present in this manner potentially. It may be prudent to correlate with pelvic ultrasound given this finding. Note that a cyst of this size places left ovary at increased risk for potential torsion. Correlation with Doppler evaluation may be reasonable in this regard. 2. Small nonobstructing calculi in each kidney, more on the left than on the right. No  hydronephrosis or ureteral calculus on either side. Urinary bladder wall thickness normal. 3.  Prominent liver with hepatic steatosis. 4. No bowel obstruction. No abscess in the abdomen or pelvis. Appendix appears normal. Electronically Signed   By: Bretta Bang III M.D.   On: 01/11/2020 12:27   US PELVIC COMPLETE W TRANSVAGINAL AND TORSION R/O  Result Date: 01/11/2020 CLINICAL DATA:  Initial evaluation for acute left lower quadrant pain. Left ovarian cyst seen on prior CT. EXAM: TRANSABDOMINAL AND TRANSVAGINAL ULTRASOUND OF PELVIS DOPPLER ULTRASOUND OF OVARIES TECHNIQUE: Both transabdominal and transvaginal ultrasound examinations of the pelvis were performed. Transabdominal technique was performed for global imaging of the pelvis including uterus, ovaries, adnexal regions, and pelvic cul-de-sac. It was necessary to proceed with endovaginal exam following the transabdominal exam to visualize the uterus, endometrium, and ovaries. Color and duplex Doppler ultrasound was utilized to evaluate blood flow to the ovaries. COMPARISON:  Prior CT from earlier same day. FINDINGS: Uterus Measurements: 8.1 x 4.1 x 4.2 cm = volume: 72.9 mL. 1 cm intramural fibroid present at the left uterine fundus. Endometrium Thickness: 3 mm.  No focal abnormality visualized. Right ovary Measurements: 2.2 x 2.7 x 2.0 cm = volume: 4.9 mL. Normal appearance/no adnexal mass. Left ovary Measurements: 6.2 x 3.4 x 6.2 cm = volume: 66.9 mL. Complex hypoechoic cystic lesion measuring up to approximately up to approximately 5 cm with internal lace-like architecture, most characteristic of a hemorrhagic cyst. Adjacent simple anechoic cyst measures up to 3.4 cm. Scattered areas of vascularity surrounding these lesions favored to reflect to lie within adjacent ovarian tissue. No definite internal solid component. Pulsed Doppler evaluation of both ovaries demonstrates normal low-resistance arterial and venous waveforms. Other findings Small volume  free fluid within the pelvis. IMPRESSION: 1. Approximate 5 cm complex left ovarian cyst, most characteristic of a hemorrhagic cyst. Although this is likely benign, given size, a short interval follow-up ultrasound in 6-12 weeks to ensure resolution is recommended. 2. Adjacent 3.4 cm simple left ovarian cyst, likely a normal physiologic follicular cyst. This could also be assessed at follow-up. 3. Small volume free fluid within  the pelvis. 4. No evidence for ovarian torsion. 5. 1 cm intramural fibroid at the uterine fundus. Electronically Signed   By: Rise Mu M.D.   On: 01/11/2020 13:51    Procedures Procedures (including critical care time)  Medications Ordered in ED Medications  oxyCODONE-acetaminophen (PERCOCET/ROXICET) 5-325 MG per tablet 1 tablet (has no administration in time range)  sodium chloride 0.9 % bolus 1,000 mL (0 mLs Intravenous Stopped 01/11/20 1442)  ondansetron (ZOFRAN) injection 4 mg (4 mg Intravenous Given 01/11/20 1016)  morphine 4 MG/ML injection 4 mg (4 mg Intravenous Given 01/11/20 1016)    ED Course  I have reviewed the triage vital signs and the nursing notes.  Pertinent labs & imaging results that were available during my care of the patient were reviewed by me and considered in my medical decision making (see chart for details).  Clinical Course as of Jan 10 1902  Wed Jan 11, 2020  1005 Differential includes musculoskeletal, renal colic, pyelonephritis   [MB]  1233 Blood work and urinalysis unremarkable.  CT showing no obstructive stones and no hydro-.  They do see left ovarian/adnexal mass and recommend ultrasound.   [MB]  1233 Ultrasound showing likely hemorrhagic cyst on the left.  Reviewed with patient.  Will send home with some pain medicine note for work.  She has an OB/GYN in [place].  Return instructions discussed   [MB]    Clinical Course User Index [MB] Terrilee Files, MD   MDM Rules/Calculators/A&P                       Final  Clinical Impression(s) / ED Diagnoses Final diagnoses:  Left flank pain  Hemorrhagic ovarian cyst    Rx / DC Orders ED Discharge Orders         Ordered    oxyCODONE-acetaminophen (PERCOCET/ROXICET) 5-325 MG tablet  Every 6 hours PRN     01/11/20 1402           Terrilee Files, MD 01/11/20 [phone]

## 2020-03-19 ENCOUNTER — Encounter (HOSPITAL_COMMUNITY): Payer: Self-pay | Admitting: *Deleted

## 2020-03-19 ENCOUNTER — Other Ambulatory Visit: Payer: Self-pay

## 2020-03-19 ENCOUNTER — Emergency Department (HOSPITAL_COMMUNITY)
Admission: EM | Admit: 2020-03-19 | Discharge: 2020-03-19 | Disposition: A | Discharge status: Home / Self Care | Payer: Medicaid Other | Attending: Emergency Medicine | Admitting: Emergency Medicine

## 2020-03-19 DIAGNOSIS — L02214 Cutaneous abscess of groin: Secondary | ICD-10-CM | POA: Insufficient documentation

## 2020-03-19 DIAGNOSIS — Z5321 Procedure and treatment not carried out due to patient leaving prior to being seen by health care provider: Secondary | ICD-10-CM | POA: Insufficient documentation

## 2020-03-19 NOTE — ED Triage Notes (Signed)
Pt states abscess to left groin since Friday. Denies any drainage or fever.

## 2020-03-19 NOTE — ED Notes (Signed)
Triage nurse informed by registration that pt left at 2045 due to wait time.

## 2020-03-20 ENCOUNTER — Emergency Department (HOSPITAL_COMMUNITY)
Admission: EM | Admit: 2020-03-20 | Discharge: 2020-03-20 | Disposition: A | Discharge status: Home / Self Care | Payer: Medicaid Other | Attending: Emergency Medicine | Admitting: Emergency Medicine

## 2020-03-20 ENCOUNTER — Encounter (HOSPITAL_COMMUNITY): Payer: Self-pay | Admitting: Emergency Medicine

## 2020-03-20 ENCOUNTER — Other Ambulatory Visit: Payer: Self-pay

## 2020-03-20 DIAGNOSIS — Z91040 Latex allergy status: Secondary | ICD-10-CM | POA: Insufficient documentation

## 2020-03-20 DIAGNOSIS — F17210 Nicotine dependence, cigarettes, uncomplicated: Secondary | ICD-10-CM | POA: Insufficient documentation

## 2020-03-20 DIAGNOSIS — L0291 Cutaneous abscess, unspecified: Secondary | ICD-10-CM

## 2020-03-20 DIAGNOSIS — R197 Diarrhea, unspecified: Secondary | ICD-10-CM | POA: Insufficient documentation

## 2020-03-20 DIAGNOSIS — L0231 Cutaneous abscess of buttock: Secondary | ICD-10-CM | POA: Insufficient documentation

## 2020-03-20 DIAGNOSIS — Z79899 Other long term (current) drug therapy: Secondary | ICD-10-CM | POA: Insufficient documentation

## 2020-03-20 MED ORDER — DOXYCYCLINE HYCLATE 100 MG PO TABS
100.0000 mg | ORAL_TABLET | Freq: Once | ORAL | Status: AC
  Administered 2020-03-20: 100 mg via ORAL
  Filled 2020-03-20: qty 1

## 2020-03-20 MED ORDER — LIDOCAINE HCL (PF) 2 % IJ SOLN
INTRAMUSCULAR | Status: AC
  Filled 2020-03-20: qty 10

## 2020-03-20 MED ORDER — LIDOCAINE HCL (PF) 2 % IJ SOLN
5.0000 mL | Freq: Once | INTRAMUSCULAR | Status: AC
  Administered 2020-03-20: 5 mL

## 2020-03-20 MED ORDER — DIPHENOXYLATE-ATROPINE 2.5-0.025 MG PO TABS
2.0000 | ORAL_TABLET | Freq: Once | ORAL | Status: AC
  Administered 2020-03-20: 2 via ORAL
  Filled 2020-03-20: qty 2

## 2020-03-20 MED ORDER — POVIDONE-IODINE 10 % EX SOLN: CUTANEOUS | Status: DC | PRN

## 2020-03-20 MED ORDER — DOXYCYCLINE HYCLATE 100 MG PO CAPS: 100.0000 mg | ORAL_CAPSULE | Freq: Two times a day (BID) | ORAL | 0 refills | Status: DC

## 2020-03-20 MED ORDER — DIPHENOXYLATE-ATROPINE 2.5-0.025 MG PO TABS: 1.0000 | ORAL_TABLET | Freq: Four times a day (QID) | ORAL | 0 refills | Status: DC | PRN

## 2020-03-20 NOTE — ED Provider Notes (Signed)
[facility] EMERGENCY DEPARTMENT Provider Note   CSN: [id number] Arrival date & time: 03/20/20  1045     History Chief Complaint  Patient presents with  . Abscess    [facility] is a 34 y.o. female presenting with abscess in her left groin which started as a small papule 4 days ago.  She endorses occasional problems with breakouts in this region.  They typically resolve on their own with warm compresses abscess has become more tender and painful.  She has applied warm compresses but it has not drained.  She denies fevers or chills.  She also mentions having diarrhea which started yesterday evening.  She had 3-4 nonbloody watery stools yesterday evening and again today.  She denies abdominal pain, nausea or vomiting.  She has had no treatments for this symptom prior to arrival.  No recent antibiotic use, no exposures to others with similar symptoms.  The history is provided by the patient.       History reviewed. No pertinent past medical history.  There are no problems to display for this patient.   History reviewed. No pertinent surgical history.   OB History    Gravida  1   Para      Term      Preterm      AB      Living        SAB      TAB      Ectopic      Multiple      Live Births              No family history on file.  Social History   Tobacco Use  . Smoking status: Current Every Day Smoker    Packs/day: 1.00    Types: Cigarettes  . Smokeless tobacco: Never Used  Substance Use Topics  . Alcohol use: Yes    Alcohol/week: 4.0 standard drinks    Types: 4 Shots of liquor per week    Comment: four per year  . Drug use: Never    Home Medications Prior to Admission medications   Medication Sig Start Date End Date Taking? Authorizing Provider  APRI 0.15-30 MG-MCG tablet Take 1 tablet by mouth daily. 06/07/19   [provider]  diphenoxylate-atropine (LOMOTIL) 2.5-0.025 MG tablet Take 1 tablet by mouth 4 (four) times daily as  needed for diarrhea or loose stools. 03/20/20   Burgess Amor, PA-C  doxycycline (VIBRAMYCIN) 100 MG capsule Take 1 capsule (100 mg total) by mouth 2 (two) times daily. 03/20/20   Burgess Amor, PA-C  ibuprofen (ADVIL) 200 MG tablet Take 400 mg by mouth every 6 (six) hours as needed.    [provider]  oxyCODONE-acetaminophen (PERCOCET/ROXICET) 5-325 MG tablet Take 1-2 tablets by mouth every 6 (six) hours as needed for severe pain. 01/11/20   Terrilee Files, MD    Allergies    Latex  Review of Systems   Review of Systems  Constitutional: Negative for chills and fever.  HENT: Negative for congestion.   Eyes: Negative.   Respiratory: Negative for chest tightness and shortness of breath.   Cardiovascular: Negative for chest pain.  Gastrointestinal: Positive for diarrhea. Negative for abdominal pain, nausea and vomiting.  Genitourinary: Negative.   Musculoskeletal: Negative for arthralgias, joint swelling and neck pain.  Skin: Positive for color change. Negative for rash and wound.       Negative except as mentioned in HPI.   Neurological: Negative for dizziness, weakness, light-headedness, numbness  and headaches.  Psychiatric/Behavioral: Negative.     Physical Exam Updated Vital Signs BP (!) 143/92 (BP Location: Left Arm)   Pulse 87   Temp 98.4 F (36.9 C) (Oral)   Resp 18   Ht 5\' 1"  (1.549 m)   Wt 90.7 kg   LMP 02/12/2020   SpO2 98%   BMI 37.79 kg/m   Physical Exam Constitutional:      General: She is not in acute distress.    Appearance: She is well-developed.  HENT:     Head: Normocephalic.  Cardiovascular:     Rate and Rhythm: Normal rate.  Pulmonary:     Effort: Pulmonary effort is normal.  Musculoskeletal:        General: Normal range of motion.     Cervical back: Neck supple.  Skin:    Findings: Abscess present.     Comments: 1 cm raised fluctuant abscess left gluteal fold.  No significant surrounding erythema.     ED Results / Procedures /  Treatments   Labs (all labs ordered are listed, but only abnormal results are displayed) Labs Reviewed - No data to display  EKG None  Radiology No results found.  Procedures Procedures (including critical care time)  INCISION AND DRAINAGE Performed by: 04/13/2020 Consent: Verbal consent obtained. Risks and benefits: risks, benefits and alternatives were discussed Type: abscess  Body area: left gluteal fold  Anesthesia: local infiltration  Incision was made with a scalpel.  Local anesthetic: lidocaine 2% without epinephrine  Anesthetic total: 5 ml  Complexity: complex Blunt dissection to break up loculations  Drainage: purulent  Drainage amount: moderate  Packing material: n/a  Patient tolerance: Patient tolerated the procedure well with no immediate complications.     Medications Ordered in ED Medications  povidone-iodine (BETADINE) 10 % external solution (has no administration in time range)  lidocaine HCl (PF) (XYLOCAINE) 2 % injection (has no administration in time range)  lidocaine HCl (PF) (XYLOCAINE) 2 % injection 5 mL (5 mLs Other Given by Other 03/20/20 1406)  diphenoxylate-atropine (LOMOTIL) 2.5-0.025 MG per tablet 2 tablet (2 tablets Oral Given 03/20/20 1407)  doxycycline (VIBRA-TABS) tablet 100 mg (100 mg Oral Given 03/20/20 1407)    ED Course  I have reviewed the triage vital signs and the nursing notes.  Pertinent labs & imaging results that were available during my care of the patient were reviewed by me and considered in my medical decision making (see chart for details).    MDM Rules/Calculators/A&P                      Small abscess with I&D tx - prescribed doxycycline.  Discussed continued warm water soaks twice daily, as needed follow-up if this does not resolve completely, recheck for any worsening symptoms.  She was given a dose of Lomotil here for her history of diarrhea.  She is afebrile and is in no acute abdominal discomfort, vital  signs stable.  Additional prescription given for Lomotil if pain symptoms persist. Final Clinical Impression(s) / ED Diagnoses Final diagnoses:  Abscess  Diarrhea, unspecified type    Rx / DC Orders ED Discharge Orders         Ordered    doxycycline (VIBRAMYCIN) 100 MG capsule  2 times daily     03/20/20 1400    diphenoxylate-atropine (LOMOTIL) 2.5-0.025 MG tablet  4 times daily PRN     03/20/20 1400           Pauline Pegues, [place],  PA-C 03/21/20 0014    Dorie Rank, MD 03/22/20 1149

## 2020-03-20 NOTE — Discharge Instructions (Addendum)
Apply warm water soaks (shower massage as discussed) twice daily to keep this open and draining.  Complete the antibiotics prescribed.  Only continue taking the lomotil if your diarrhea persists.  Make sure you are drinking plenty of fluids.

## 2020-03-20 NOTE — ED Triage Notes (Signed)
Pt c/o abscess to LT groin since Friday. Denies fever or drainage. Pt states she began having diarrhea yesterday.

## 2020-04-10 ENCOUNTER — Encounter: Payer: Self-pay | Admitting: Internal Medicine

## 2020-06-13 ENCOUNTER — Ambulatory Visit: Payer: Self-pay | Admitting: Nurse Practitioner

## 2020-06-13 ENCOUNTER — Encounter: Payer: Self-pay | Admitting: Nurse Practitioner

## 2020-06-13 NOTE — Progress Notes (Deleted)
Primary Care Physician:  Patient, No Pcp Per Primary Gastroenterologist:  Dr.   Bonnetta Barry chief complaint on file.   HPI:   Rachael Dixon is a 34 y.o. female who presents on referral from primary care/women's health for diarrhea.  Reviewed information provided with referral including office visit dated 04/02/2020.  At that time the patient reported 3-4 loose stools a day since the birth of her son 13 years prior but is gotten worse in the past year with as many as 15 stools a day.  Has seen dark stools but no overt hematochezia.  She is interested in a GI referral.  No history of colonoscopy or endoscopy in our system.  Today she states she is doing okay overall.  No past medical history on file.  No past surgical history on file.  Current Outpatient Medications  Medication Sig Dispense Refill  . APRI 0.15-30 MG-MCG tablet Take 1 tablet by mouth daily.    . diphenoxylate-atropine (LOMOTIL) 2.5-0.025 MG tablet Take 1 tablet by mouth 4 (four) times daily as needed for diarrhea or loose stools. 10 tablet 0  . doxycycline (VIBRAMYCIN) 100 MG capsule Take 1 capsule (100 mg total) by mouth 2 (two) times daily. 20 capsule 0  . ibuprofen (ADVIL) 200 MG tablet Take 400 mg by mouth every 6 (six) hours as needed.    Marland Kitchen oxyCODONE-acetaminophen (PERCOCET/ROXICET) 5-325 MG tablet Take 1-2 tablets by mouth every 6 (six) hours as needed for severe pain. 12 tablet 0   No current facility-administered medications for this visit.    Allergies as of 06/13/2020 - Review Complete 03/20/2020  Allergen Reaction Noted  . Latex  01/11/2020    No family history on file.  Social History   Socioeconomic History  . Marital status: Single    Spouse name: Not on file  . Number of children: Not on file  . Years of education: Not on file  . Highest education level: Not on file  Occupational History  . Not on file  Tobacco Use  . Smoking status: Current Every Day Smoker    Packs/day: 1.00    Types:  Cigarettes  . Smokeless tobacco: Never Used  Vaping Use  . Vaping Use: Never used  Substance and Sexual Activity  . Alcohol use: Yes    Alcohol/week: 4.0 standard drinks    Types: 4 Shots of liquor per week    Comment: four per year  . Drug use: Never  . Sexual activity: Yes  Other Topics Concern  . Not on file  Social History Narrative  . Not on file   Social Determinants of Health   Financial Resource Strain:   . Difficulty of Paying Living Expenses: Not on file  Food Insecurity:   . Worried About Programme researcher, broadcasting/film/video in the Last Year: Not on file  . Ran Out of Food in the Last Year: Not on file  Transportation Needs:   . Lack of Transportation (Medical): Not on file  . Lack of Transportation (Non-Medical): Not on file  Physical Activity:   . Days of Exercise per Week: Not on file  . Minutes of Exercise per Session: Not on file  Stress:   . Feeling of Stress : Not on file  Social Connections:   . Frequency of Communication with Friends and Family: Not on file  . Frequency of Social Gatherings with Friends and Family: Not on file  . Attends Religious Services: Not on file  . Active Member of Clubs or  Organizations: Not on file  . Attends Banker Meetings: Not on file  . Marital Status: Not on file  Intimate Partner Violence:   . Fear of Current or Ex-Partner: Not on file  . Emotionally Abused: Not on file  . Physically Abused: Not on file  . Sexually Abused: Not on file    Subjective: Review of Systems  Constitutional: Negative for chills, fever, malaise/fatigue and weight loss.  HENT: Negative for congestion and sore throat.   Respiratory: Negative for cough and shortness of breath.   Cardiovascular: Negative for chest pain and palpitations.  Gastrointestinal: Negative for abdominal pain, blood in stool, diarrhea, melena, nausea and vomiting.  Musculoskeletal: Negative for joint pain and myalgias.  Skin: Negative for rash.  Neurological: Negative  for dizziness and weakness.  Endo/Heme/Allergies: Does not bruise/bleed easily.  Psychiatric/Behavioral: Negative for depression. The patient is not nervous/anxious.   All other systems reviewed and are negative.      Objective: There were no vitals taken for this visit. Physical Exam Vitals and nursing note reviewed.  Constitutional:      General: She is not in acute distress.    Appearance: Normal appearance. She is well-developed. She is not ill-appearing, toxic-appearing or diaphoretic.  HENT:     Head: Normocephalic and atraumatic.     Nose: No congestion or rhinorrhea.  Eyes:     General: No scleral icterus. Cardiovascular:     Rate and Rhythm: Normal rate and regular rhythm.     Heart sounds: Normal heart sounds.  Pulmonary:     Effort: Pulmonary effort is normal. No respiratory distress.     Breath sounds: Normal breath sounds.  Abdominal:     General: Bowel sounds are normal.     Palpations: Abdomen is soft. There is no hepatomegaly, splenomegaly or mass.     Tenderness: There is no abdominal tenderness. There is no guarding or rebound.     Hernia: No hernia is present.  Skin:    General: Skin is warm and dry.     Coloration: Skin is not jaundiced.     Findings: No rash.  Neurological:     General: No focal deficit present.     Mental Status: She is alert and oriented to person, place, and time.  Psychiatric:        Attention and Perception: Attention normal.        Mood and Affect: Mood normal.        Speech: Speech normal.        Behavior: Behavior normal.        Thought Content: Thought content normal.        Cognition and Memory: Cognition and memory normal.      Assessment:  ***   Plan: ***      06/13/2020 1:21 PM   Disclaimer: This note was dictated with voice recognition software. Similar sounding words can inadvertently be transcribed and may not be corrected upon review.

## 2020-10-09 ENCOUNTER — Other Ambulatory Visit: Payer: Self-pay

## 2020-10-09 ENCOUNTER — Encounter (HOSPITAL_COMMUNITY): Payer: Self-pay | Admitting: Emergency Medicine

## 2020-10-09 ENCOUNTER — Emergency Department (HOSPITAL_COMMUNITY)
Admission: EM | Admit: 2020-10-09 | Discharge: 2020-10-09 | Disposition: A | Discharge status: Home / Self Care | Payer: Medicaid Other | Attending: Emergency Medicine | Admitting: Emergency Medicine

## 2020-10-09 DIAGNOSIS — Z20822 Contact with and (suspected) exposure to covid-19: Secondary | ICD-10-CM | POA: Insufficient documentation

## 2020-10-09 DIAGNOSIS — R197 Diarrhea, unspecified: Secondary | ICD-10-CM

## 2020-10-09 DIAGNOSIS — F17210 Nicotine dependence, cigarettes, uncomplicated: Secondary | ICD-10-CM | POA: Insufficient documentation

## 2020-10-09 DIAGNOSIS — K529 Noninfective gastroenteritis and colitis, unspecified: Secondary | ICD-10-CM | POA: Insufficient documentation

## 2020-10-09 DIAGNOSIS — Z91040 Latex allergy status: Secondary | ICD-10-CM | POA: Insufficient documentation

## 2020-10-09 LAB — CBC WITH DIFFERENTIAL/PLATELET
Abs Immature Granulocytes: 0.02 10*3/uL (ref 0.00–0.07)
Basophils Absolute: 0.1 10*3/uL (ref 0.0–0.1)
Basophils Relative: 1 %
Eosinophils Absolute: 0.1 10*3/uL (ref 0.0–0.5)
Eosinophils Relative: 1 %
HCT: 41.8 % (ref 36.0–46.0)
Hemoglobin: 13.9 g/dL (ref 12.0–15.0)
Immature Granulocytes: 0 %
Lymphocytes Relative: 34 %
Lymphs Abs: 3.6 10*3/uL (ref 0.7–4.0)
MCH: 29.3 pg (ref 26.0–34.0)
MCHC: 33.3 g/dL (ref 30.0–36.0)
MCV: 88 fL (ref 80.0–100.0)
Monocytes Absolute: 0.6 10*3/uL (ref 0.1–1.0)
Monocytes Relative: 6 %
Neutro Abs: 6.2 10*3/uL (ref 1.7–7.7)
Neutrophils Relative %: 58 %
Platelets: 334 10*3/uL (ref 150–400)
RBC: 4.75 MIL/uL (ref 3.87–5.11)
RDW: 13 % (ref 11.5–15.5)
WBC: 10.6 10*3/uL — ABNORMAL HIGH (ref 4.0–10.5)
nRBC: 0 % (ref 0.0–0.2)

## 2020-10-09 LAB — URINALYSIS, ROUTINE W REFLEX MICROSCOPIC
Bilirubin Urine: NEGATIVE
Glucose, UA: NEGATIVE mg/dL
Ketones, ur: NEGATIVE mg/dL
Nitrite: NEGATIVE
Protein, ur: 30 mg/dL — AB
Specific Gravity, Urine: 1.019 (ref 1.005–1.030)
pH: 6 (ref 5.0–8.0)

## 2020-10-09 LAB — RESP PANEL BY RT-PCR (FLU A&B, COVID) ARPGX2
Influenza A by PCR: NEGATIVE
Influenza B by PCR: NEGATIVE
SARS Coronavirus 2 by RT PCR: NEGATIVE

## 2020-10-09 LAB — COMPREHENSIVE METABOLIC PANEL
ALT: 30 U/L (ref 0–44)
AST: 31 U/L (ref 15–41)
Albumin: 3.6 g/dL (ref 3.5–5.0)
Alkaline Phosphatase: 38 U/L (ref 38–126)
Anion gap: 10 (ref 5–15)
BUN: 6 mg/dL (ref 6–20)
CO2: 26 mmol/L (ref 22–32)
Calcium: 8.9 mg/dL (ref 8.9–10.3)
Chloride: 100 mmol/L (ref 98–111)
Creatinine, Ser: 0.52 mg/dL (ref 0.44–1.00)
GFR, Estimated: >60 mL/min (ref >60)
Glucose, Bld: 83 mg/dL (ref 70–99)
Potassium: 3.5 mmol/L (ref 3.5–5.1)
Sodium: 136 mmol/L (ref 135–145)
Total Bilirubin: 0.3 mg/dL (ref 0.3–1.2)
Total Protein: 7.2 g/dL (ref 6.5–8.1)

## 2020-10-09 LAB — LIPASE, BLOOD: Lipase: 20 U/L (ref 11–51)

## 2020-10-09 LAB — C DIFFICILE (CDIFF) QUICK SCRN (NO PCR REFLEX)
C Diff antigen: NEGATIVE
C Diff interpretation: NOT DETECTED
C Diff toxin: NEGATIVE

## 2020-10-09 LAB — I-STAT BETA HCG BLOOD, ED (MC, WL, AP ONLY): I-stat hCG, quantitative: <5 m[IU]/mL (ref <5)

## 2020-10-09 MED ORDER — SODIUM CHLORIDE 0.9 % IV BOLUS
1000.0000 mL | Freq: Once | INTRAVENOUS | Status: AC
  Administered 2020-10-09: 1000 mL via INTRAVENOUS

## 2020-10-09 NOTE — ED Provider Notes (Signed)
[facility] EMERGENCY DEPARTMENT Provider Note   CSN: [id number] Arrival date & time: 10/09/20  1003     History Chief Complaint  Patient presents with  . Diarrhea    Rachael Dixon is a 34 y.o. female otherwise healthy no daily medication use.  Patient presents today for concern of diarrhea.  She reports that last week she was helping take care of her father who is currently suffering with C. difficile.  She reports she cleaned up some stool with her mother wanting care of him.  She reports 4 days ago on the night of 10/05/2020 she developed some mild nonbloody diarrhea.  She reports since that time she has had increasing amount of diarrhea multiple episodes every day.  She reports this is associated with some mild lower abdominal cramping sensation nonradiating worsened diarrhea improved with rest.  She reports one episode of vomiting 3 days ago which is not reoccurred reports her nausea has gradually improved as well.  She is very concerned for possible C. difficile today and would like testing.  She reports she is not taking any medications for her symptoms she has no other complaints or concerns.  Patient denies fever/chills, chest pain/shortness of breath, cough, recurrence of vomiting, hematemesis, dysuria/hematuria, vaginal bleeding/discharge, hematochezia, melena or any additional concerns.  HPI     History reviewed. No pertinent past medical history.  There are no problems to display for this patient.   History reviewed. No pertinent surgical history.   OB History    Gravida  1   Para      Term      Preterm      AB      Living        SAB      IAB      Ectopic      Multiple      Live Births              History reviewed. No pertinent family history.  Social History   Tobacco Use  . Smoking status: Current Every Day Smoker    Packs/day: 1.00    Types: Cigarettes  . Smokeless tobacco: Never Used  Vaping Use  . Vaping Use: Never used   Substance Use Topics  . Alcohol use: Yes    Alcohol/week: 4.0 standard drinks    Types: 4 Shots of liquor per week    Comment: four per year  . Drug use: Never    Home Medications Prior to Admission medications   Medication Sig Start Date End Date Taking? Authorizing Provider  APRI 0.15-30 MG-MCG tablet Take 1 tablet by mouth daily. 06/07/19   [provider]  diphenoxylate-atropine (LOMOTIL) 2.5-0.025 MG tablet Take 1 tablet by mouth 4 (four) times daily as needed for diarrhea or loose stools. 03/20/20   Burgess Amor, PA-C  doxycycline (VIBRAMYCIN) 100 MG capsule Take 1 capsule (100 mg total) by mouth 2 (two) times daily. 03/20/20   Burgess Amor, PA-C  ibuprofen (ADVIL) 200 MG tablet Take 400 mg by mouth every 6 (six) hours as needed.    [provider]  oxyCODONE-acetaminophen (PERCOCET/ROXICET) 5-325 MG tablet Take 1-2 tablets by mouth every 6 (six) hours as needed for severe pain. 01/11/20   Terrilee Files, MD    Allergies    Latex  Review of Systems   Review of Systems Ten systems are reviewed and are negative for acute change except as noted in the HPI  Physical Exam Updated Vital Signs BP 121/88 (  BP Location: Left Arm)   Pulse 81   Temp 98.6 F (37 C) (Oral)   Resp 18   Ht 5\' 1"  (1.549 m)   Wt 81.6 kg   SpO2 97%   BMI 34.01 kg/m   Physical Exam Constitutional:      General: She is not in acute distress.    Appearance: Normal appearance. She is well-developed. She is not ill-appearing or diaphoretic.  HENT:     Head: Normocephalic and atraumatic.  Eyes:     General: Vision grossly intact. Gaze aligned appropriately.     Pupils: Pupils are equal, round, and reactive to light.  Neck:     Trachea: Trachea and phonation normal.  Pulmonary:     Effort: Pulmonary effort is normal. No respiratory distress.  Abdominal:     General: There is no distension.     Palpations: Abdomen is soft.     Tenderness: There is no abdominal tenderness. There is no  guarding or rebound. Negative signs include Murphy's sign, Rovsing's sign and McBurney's sign.  Musculoskeletal:        General: Normal range of motion.     Cervical back: Normal range of motion.  Skin:    General: Skin is warm and dry.  Neurological:     Mental Status: She is alert.     GCS: GCS eye subscore is 4. GCS verbal subscore is 5. GCS motor subscore is 6.     Comments: Speech is clear and goal oriented, follows commands Major Cranial nerves without deficit, no facial droop Moves extremities without ataxia, coordination intact  Psychiatric:        Behavior: Behavior normal.     ED Results / Procedures / Treatments   Labs (all labs ordered are listed, but only abnormal results are displayed) Labs Reviewed  CBC WITH DIFFERENTIAL/PLATELET - Abnormal; Notable for the following components:      Result Value   WBC 10.6 (*)    All other components within normal limits  URINALYSIS, ROUTINE W REFLEX MICROSCOPIC - Abnormal; Notable for the following components:   APPearance HAZY (*)    Hgb urine dipstick MODERATE (*)    Protein, ur 30 (*)    Leukocytes,Ua SMALL (*)    Bacteria, UA RARE (*)    All other components within normal limits  C DIFFICILE (CDIFF) QUICK SCRN (NO PCR REFLEX)  RESP PANEL BY RT-PCR (FLU A&B, COVID) [id number]  GASTROINTESTINAL PANEL BY PCR, STOOL (REPLACES STOOL CULTURE)  COMPREHENSIVE METABOLIC PANEL  LIPASE, BLOOD  I-STAT BETA HCG BLOOD, ED ([facility], [facility], [facility] ONLY)    EKG None  Radiology No results found.  Procedures Procedures (including critical care time)  Medications Ordered in ED Medications  sodium chloride 0.9 % bolus 1,000 mL (0 mLs Intravenous Stopped 10/09/20 1429)    ED Course  I have reviewed the triage vital signs and the nursing notes.  Pertinent labs & imaging results that were available during my care of the patient were reviewed by me and considered in my medical decision making (see chart for details).    MDM  Rules/Calculators/A&P                         Additional history obtained from: 1. Nursing notes from this visit. ------------------------- I ordered, reviewed and interpreted labs which include: C. difficile panel negative Pregnancy test negative Covid/influenza panel negative Lipase within normal limits CMP within normal limits.  No emergent electrolyte derangement, AKI, LFT  elevations or gap. CBC shows mild leukocytosis of 10.6, no anemia. Urinalysis is contaminated and is nitrite negative patient without urinary symptoms doubt UTI. GI stool panel pending, will not result for at least 24 hours per lab. - On reevaluation patient is resting comfortably talking on the phone no acute distress she has been drinking water given by nursing staff.  She has no complaints or concerns and would like to be discharged.  I updated patient on findings above and she states understanding.  Suspect patient with likely viral gastroenteritis which appears to have been improving.  She has not had any recurrence of vomiting in the past few days and is eating and drinking well.  Vital signs are stable she has no fever tachycardia hypotension or tachypnea.  Patient does not meet SIRS/sepsis criteria.  On repeat exam there is no abdominal tenderness, low suspicion for appendicitis, cholecystitis, SBO, perforation, diverticulitis, pancreatitis, kidney stone disease or other emergent pathologies.  Low suspicion for bacterial enteritis given lack of bloody diarrhea.  Patient will follow up on her stool panel on her MyChart account and discuss that with her PCP at follow-up appointment this week.  At this time there does not appear to be any evidence of an acute emergency medical condition and the patient appears stable for discharge with appropriate outpatient follow up. Diagnosis was discussed with patient who verbalizes understanding of care plan and is agreeable to discharge. I have discussed return precautions with  patient who verbalizes understanding. Patient encouraged to follow-up with their PCP. All questions answered.  Patient's case discussed with Dr. Charm Barges who agrees with plan to discharge with follow-up.   Note: Portions of this report may have been transcribed using voice recognition software. Every effort was made to ensure accuracy; however, inadvertent computerized transcription errors may still be present. Final Clinical Impression(s) / ED Diagnoses Final diagnoses:  Diarrhea, unspecified type  Gastroenteritis    Rx / DC Orders ED Discharge Orders    None       Elizabeth Palau 10/09/20 1712    Terrilee Files, MD 10/10/20 1255

## 2020-10-09 NOTE — ED Notes (Signed)
Notified pt. We need a stool sample and a urine sample. Placed a hat in the toilet to collect a stool sample.

## 2020-10-09 NOTE — ED Triage Notes (Signed)
Pt c/o of n/v, headache, back pain and abdominal pain x3 days. States she was around someone who has cdiff

## 2020-10-09 NOTE — Discharge Instructions (Addendum)
At this time there does not appear to be the presence of an emergent medical condition, however there is always the potential for conditions to change. Please read and follow the below instructions.  Please return to the Emergency Department immediately for any new or worsening symptoms. Please be sure to follow up with your Primary Care Provider within one week regarding your visit today; please call their office to schedule an appointment even if you are feeling better for a follow-up visit. Please be sure to drink plenty water to avoid dehydration.  Get plenty of rest.  Your stool panel is currently in process and should resolve in the next 1 day.  Please check your MyChart account tomorrow for your results and call your primary care provider to discuss those results at your follow-up visit.  If your stool panel results are positive then you should be called with a prescription for antibiotic.  Go to the nearest Emergency Department immediately if: You have fever or chills You have chest pain. You feel very weak or you pass out (faint). You have bloody or black poop or poop that looks like tar. You have very bad pain, cramping, or bloating in your belly (abdomen). You have trouble breathing or you are breathing very quickly. Your heart is beating very quickly. Your skin feels cold and clammy. You have pain in 1 part of your belly such as your right lower belly or right upper belly pain. You feel confused. You have signs of losing too much water in your body, such as: Dark pee, very little pee, or no pee. Cracked lips. Dry mouth. Sunken eyes. Sleepiness. Weakness. You have any new/concerning or worsening of symptoms   Please read the additional information packets attached to your discharge summary.  Do not take your medicine if  develop an itchy rash, swelling in your mouth or lips, or difficulty breathing; call 911 and seek immediate emergency medical attention if this occurs.  You  may review your lab tests and imaging results in their entirety on your MyChart account.  Please discuss all results of fully with your primary care provider and other specialist at your follow-up visit.  Note: Portions of this text may have been transcribed using voice recognition software. Every effort was made to ensure accuracy; however, inadvertent computerized transcription errors may still be present.

## 2020-10-10 LAB — GASTROINTESTINAL PANEL BY PCR, STOOL (REPLACES STOOL CULTURE)

## 2020-10-17 IMAGING — CT CT RENAL STONE PROTOCOL
2 series · 15 of 42 positions shown, 18 images · non-contrast
Comparison: July 25, 2019

CLINICAL DATA: Left flank back pain

EXAM:
CT ABDOMEN AND PELVIS WITHOUT CONTRAST
TECHNIQUE: Multidetector CT imaging of the abdomen and pelvis was performed
following the standard protocol without oral or IV contrast.

[Series 2: axial st · axial · 0.69mm/px · z∈[+1037,+1457]mm · 12 of 94 slices shown, 15 images]
[im 7/94  soft-tissue]
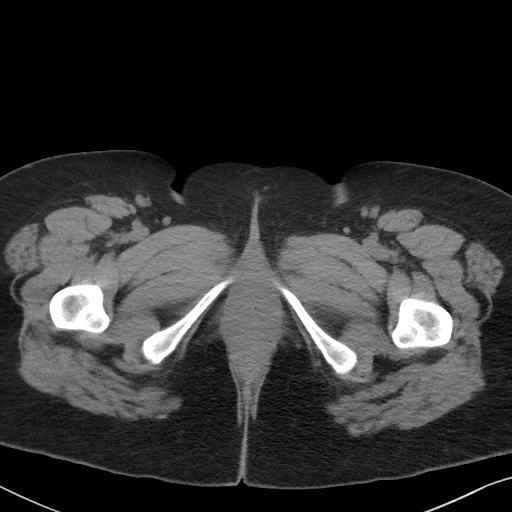
[im 7/94  bone]
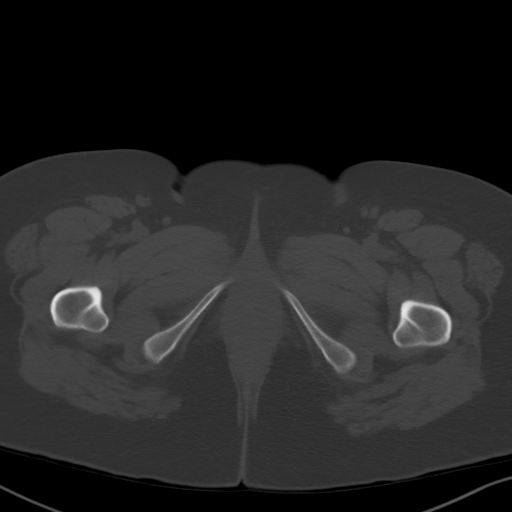
[im 19/94  soft-tissue]
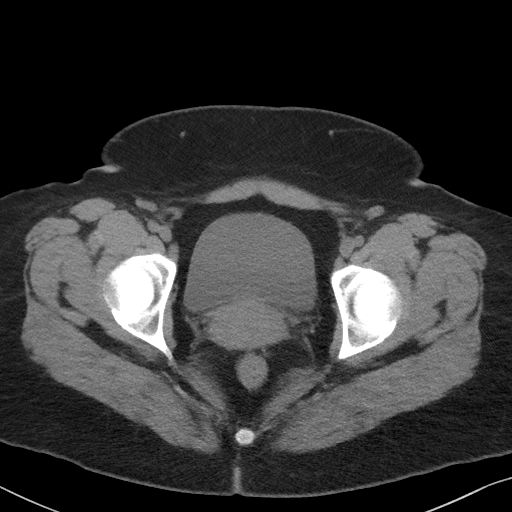
[im 28/94  soft-tissue]
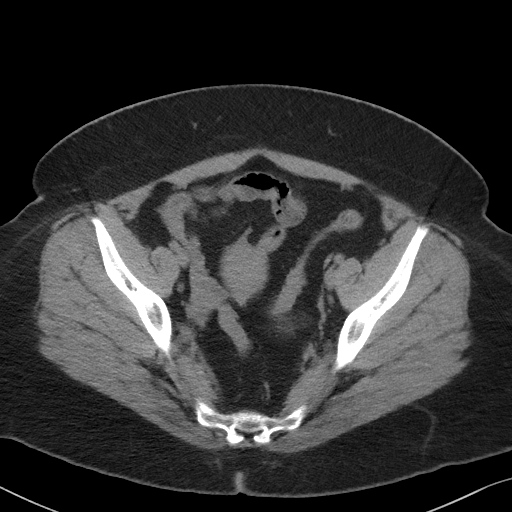
[im 37/94  soft-tissue]
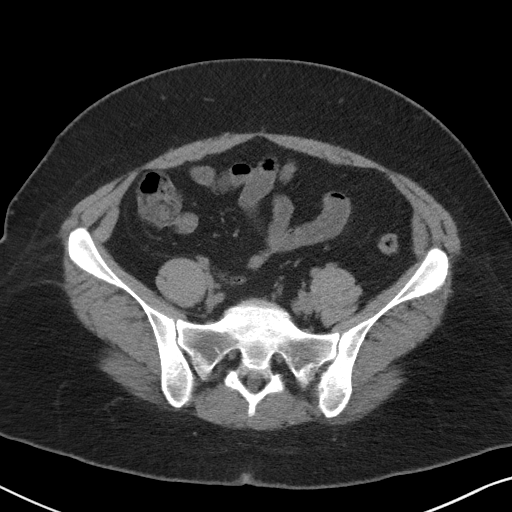
[im 49/94  soft-tissue]
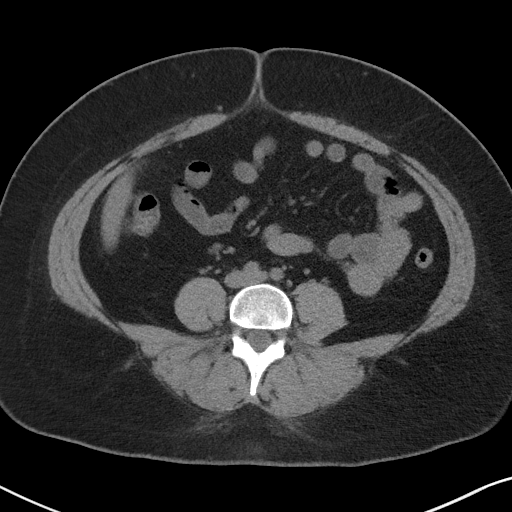
[im 58/94  soft-tissue]
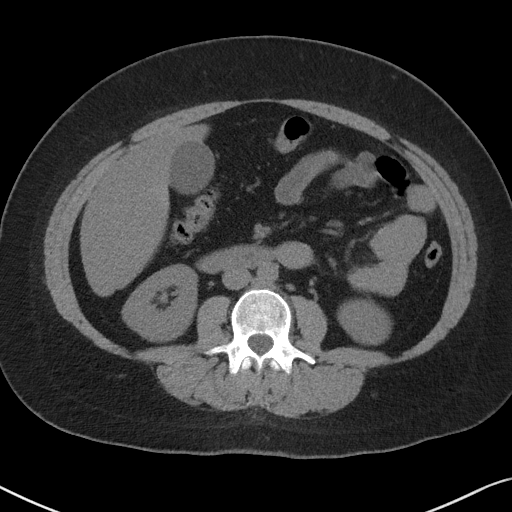
[im 67/94  soft-tissue]
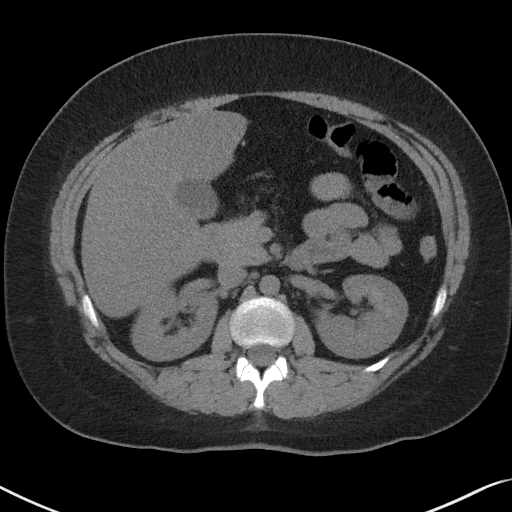
[im 79/94  soft-tissue]
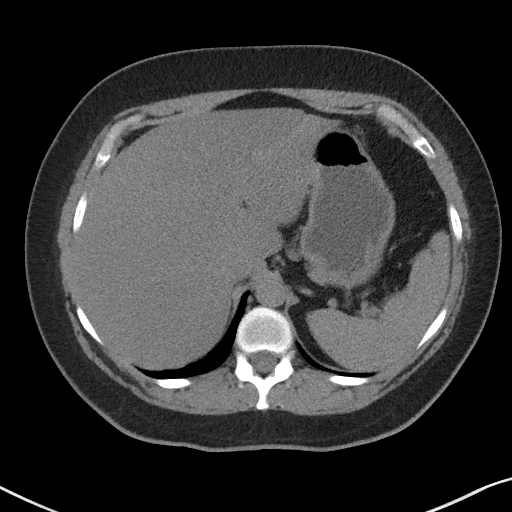
[im 82/94  lung]
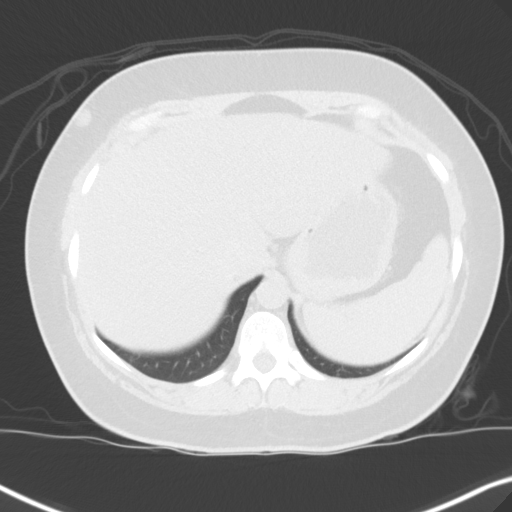
[im 85/94  lung]
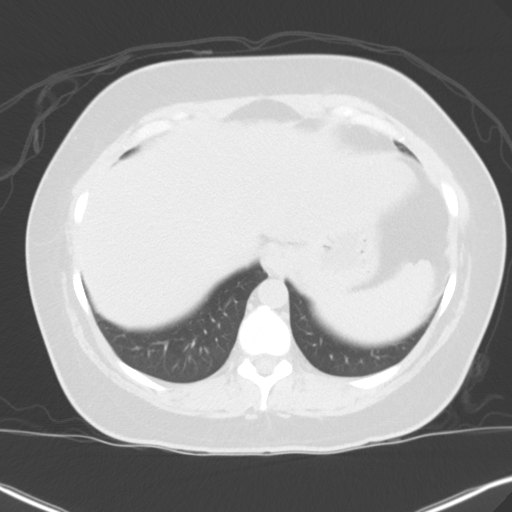
[im 88/94  soft-tissue]
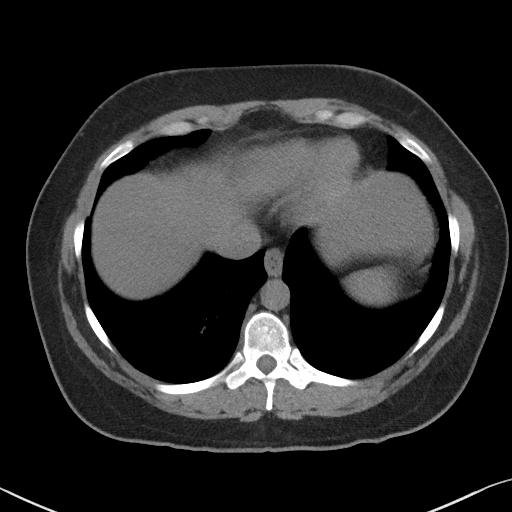
[im 88/94  lung]
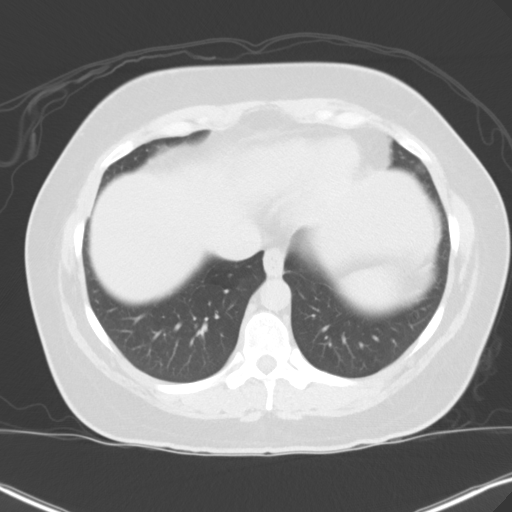
[im 88/94  bone]
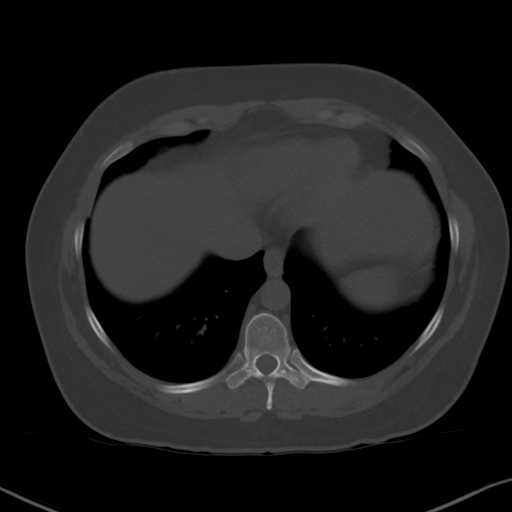
[im 91/94  lung]
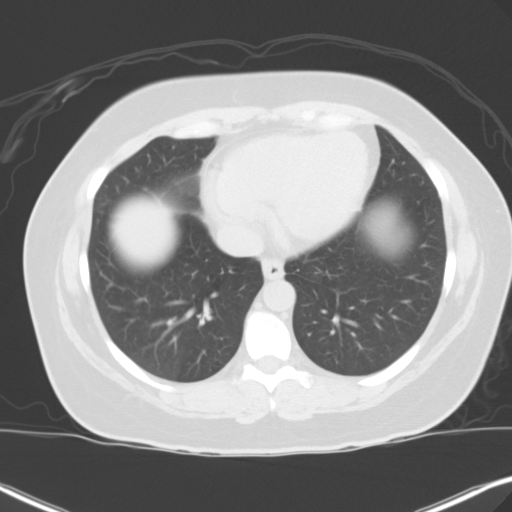

[Series 5: coronal st · coronal · 0.90mm/px · 3 of 101 slices shown]
[im 34/101  soft-tissue]
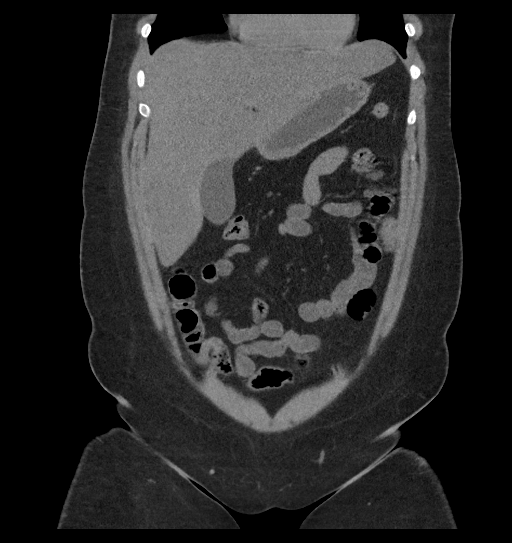
[im 45/101  soft-tissue]
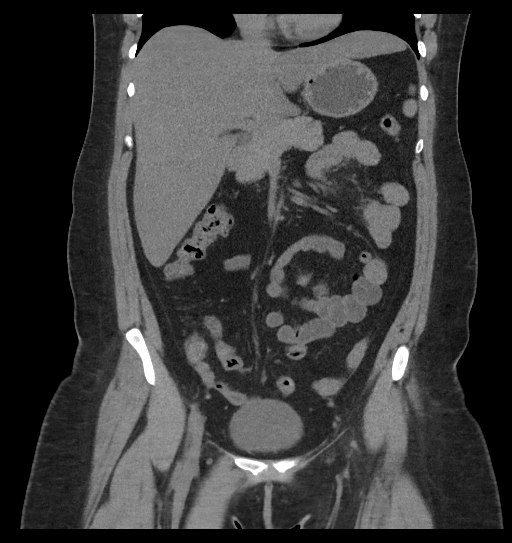
[im 56/101  soft-tissue]
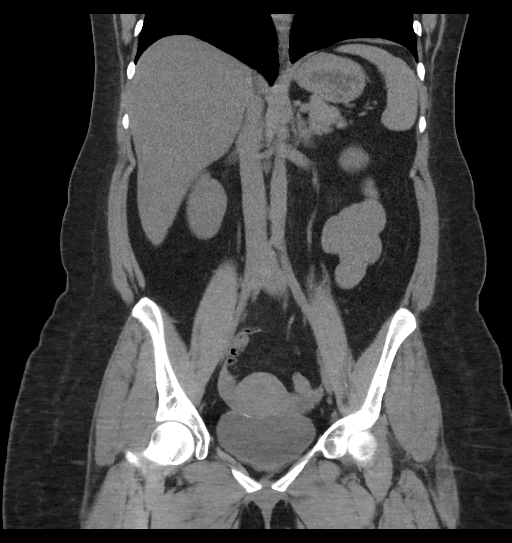

[15 of 42 positions shown; findings below may reference images not displayed]

FINDINGS: Lower chest: Lung bases are clear.

Hepatobiliary: Liver measures 21.8 cm in length. There is hepatic
steatosis. No focal liver lesions are evident on this noncontrast
enhanced study. Gallbladder wall is not appreciably thickened. There
is no biliary duct dilatation.

Pancreas: There is no pancreatic mass or inflammatory focus.

Spleen: No splenic lesions are evident.

Adrenals/Urinary Tract: Adrenals bilaterally appear normal. There is
no appreciable renal mass or hydronephrosis on either side. There is
a 1 mm calculus, nonobstructing, in the lower pole the right kidney.
There are several 1-3 mm calculi in the lower pole the left kidney,
nonobstructing. There is no appreciable ureteral calculus on either
side. Urinary bladder is midline with wall thickness within normal
limits.

Stomach/Bowel: There is no appreciable bowel wall or mesenteric
thickening. Terminal ileum appears normal. There is no evident bowel
obstruction. There is no appreciable free air or portal venous air.

Vascular/Lymphatic: No abdominal aortic aneurysm. No vascular
lesions evident. There is a circumaortic left renal vein, an
anatomic variant. There is no evident adenopathy in the abdomen or
pelvis.

Reproductive: Uterus is anteverted. There is a cystic left
ovarian/adnexal mass measuring 5.2 x 4.3 x 3.4 cm. There is minimal
calcification in a portion of the wall of this cystic mass lesion on
the left. No other pelvic masses are evident.

Other: The appendix appears normal. No evident abscess or ascites in
the abdomen or pelvis. There is slight fat in the umbilicus.

Musculoskeletal: There are no blastic or lytic bone lesions. No
intramuscular lesions are evident.
IMPRESSION: 1. Cystic left ovarian/adnexal mass measuring 5.2 x 4.3 x 3.4 cm.
There is minimal thin calcification in the wall of this mass.
Suspect simple ovarian cyst, although ovarian cystadenoma could
present in this manner potentially. It may be prudent to correlate
with pelvic ultrasound given this finding. Note that a cyst of this
size places left ovary at increased risk for potential torsion.
Correlation with Doppler evaluation may be reasonable in this
regard.

2. Small nonobstructing calculi in each kidney, more on the left
than on the right. No hydronephrosis or ureteral calculus on either
side. Urinary bladder wall thickness normal.

3.  Prominent liver with hepatic steatosis.

4. No bowel obstruction. No abscess in the abdomen or pelvis.
Appendix appears normal.

## 2020-10-17 IMAGING — US US PELVIS COMPLETE TRANSABD/TRANSVAG W DUPLEX
1 series · 13 of 25 positions shown · non-contrast
Comparison: Prior CT from earlier same day.

CLINICAL DATA: Initial evaluation for acute left lower quadrant
pain. Left ovarian cyst seen on prior CT.

EXAM:
TRANSABDOMINAL AND TRANSVAGINAL ULTRASOUND OF PELVIS
DOPPLER ULTRASOUND OF OVARIES
TECHNIQUE: Both transabdominal and transvaginal ultrasound examinations of the
pelvis were performed. Transabdominal technique was performed for
global imaging of the pelvis including uterus, ovaries, adnexal
regions, and pelvic cul-de-sac.
It was necessary to proceed with endovaginal exam following the
transabdominal exam to visualize the uterus, endometrium, and
ovaries. Color and duplex Doppler ultrasound was utilized to
evaluate blood flow to the ovaries.

[Series 1: us pelvic complete w transvaginal and torsion righ · 13 of 91 slices shown]
[im 1/91]
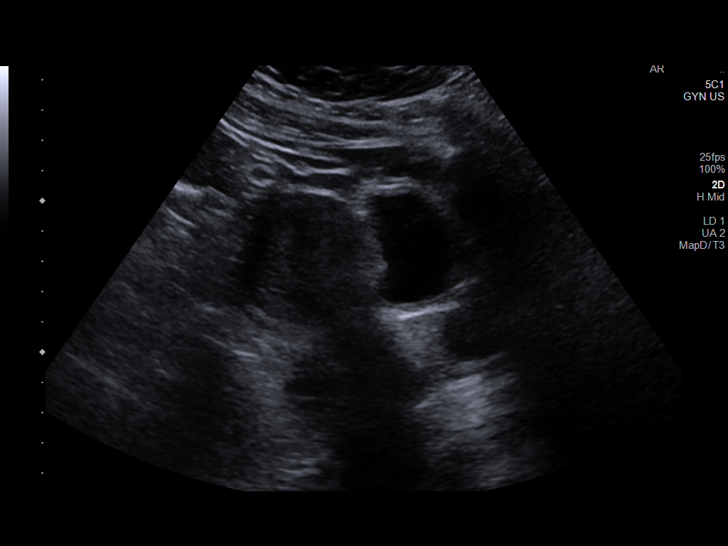
[im 8/91]
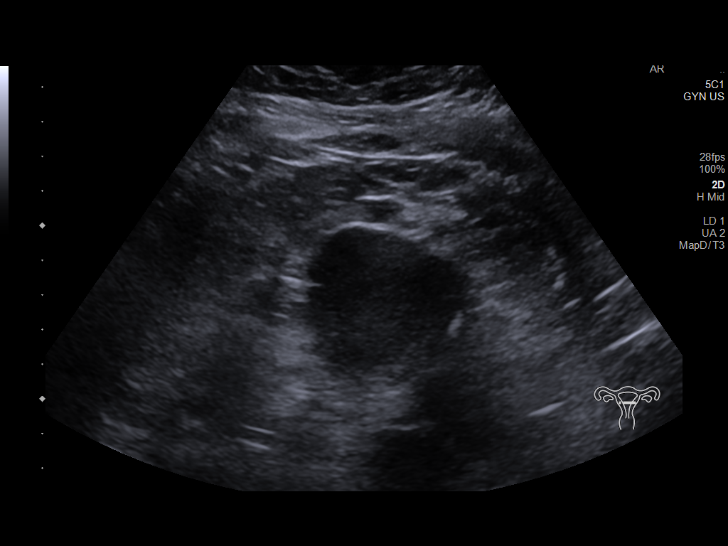
[im 16/91]
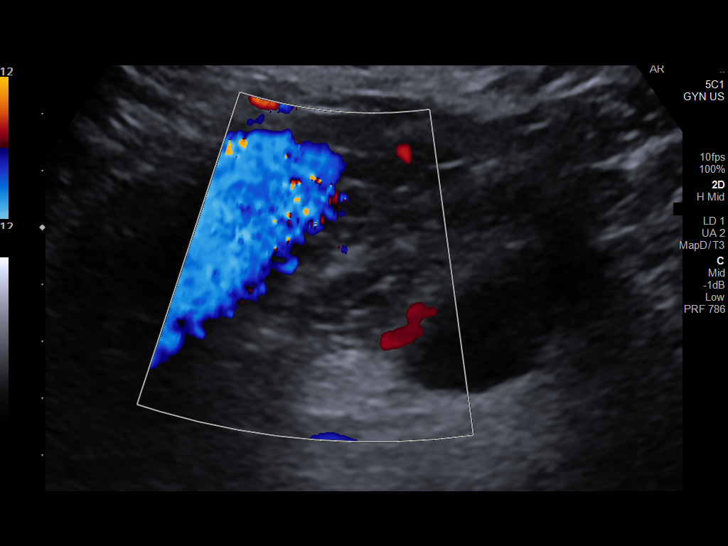
[im 23/91]
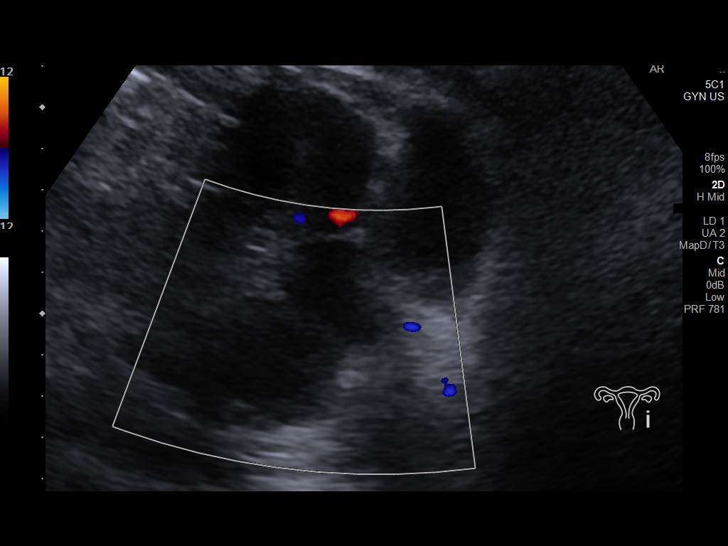
[im 31/91]
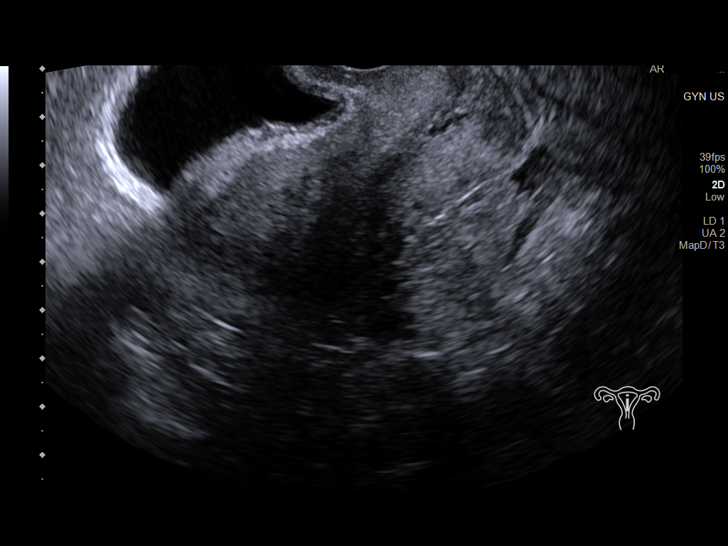
[im 38/91]
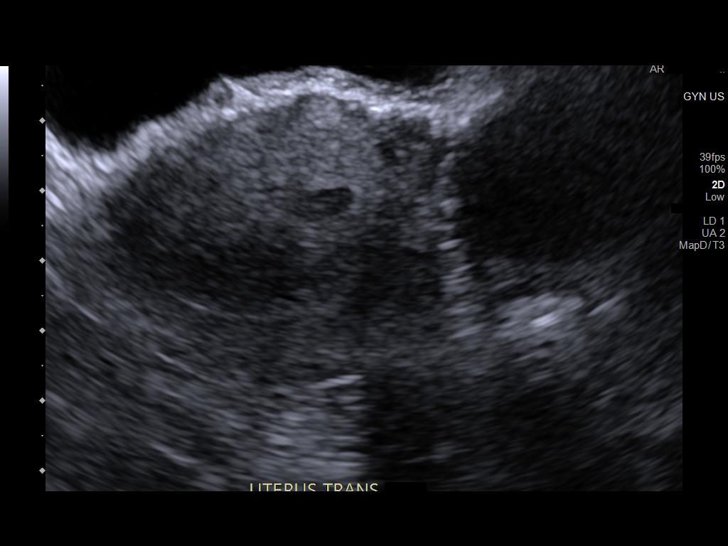
[im 46/91]
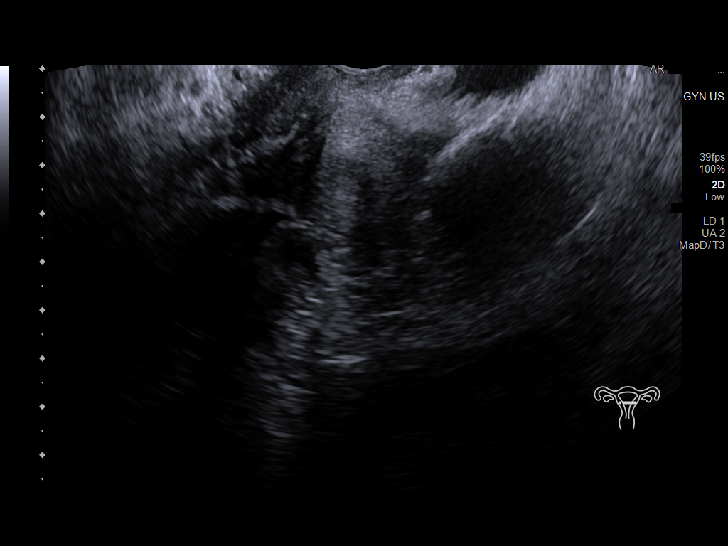
[im 53/91]
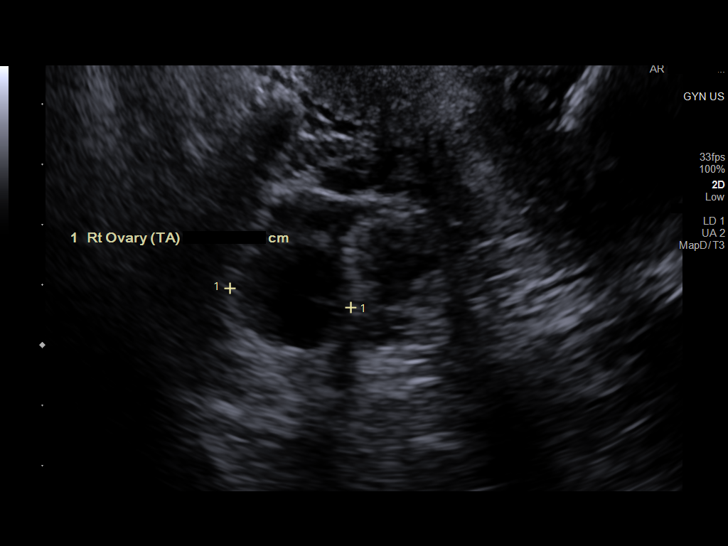
[im 61/91]
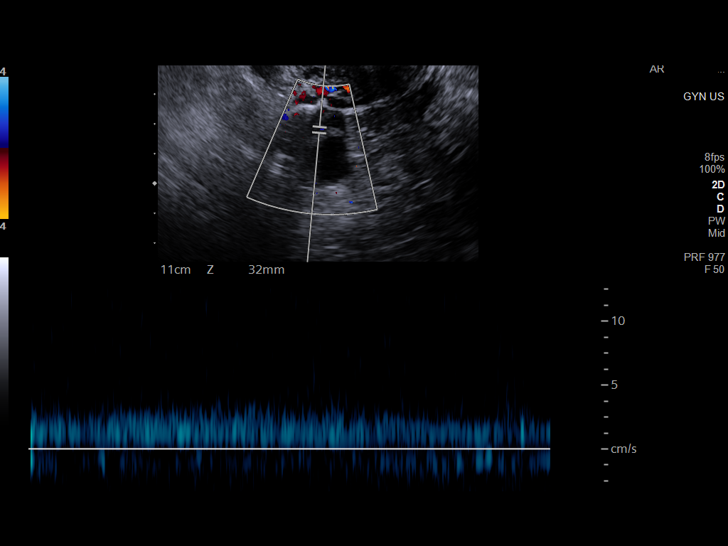
[im 68/91]
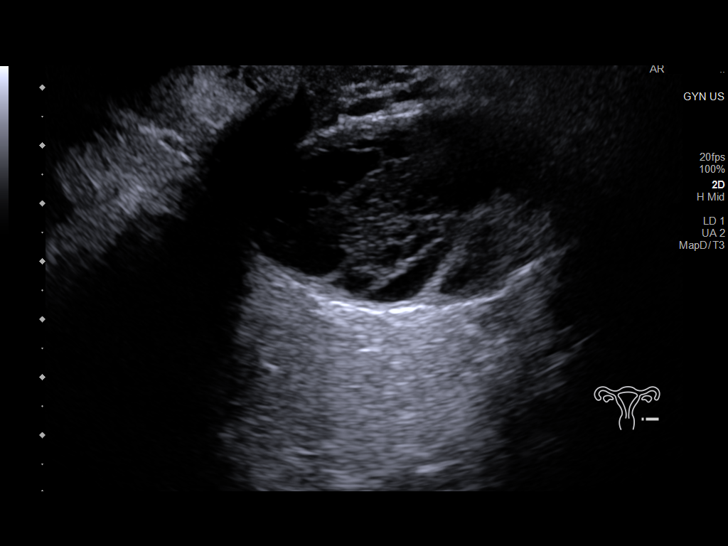
[im 76/91]
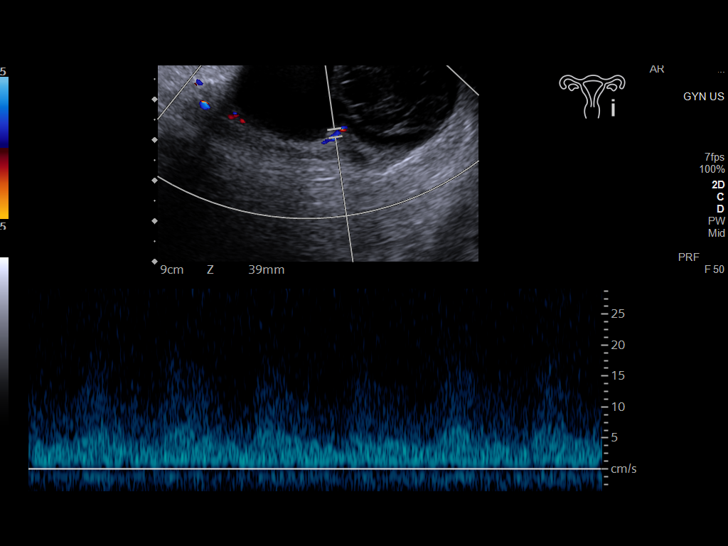
[im 83/91]
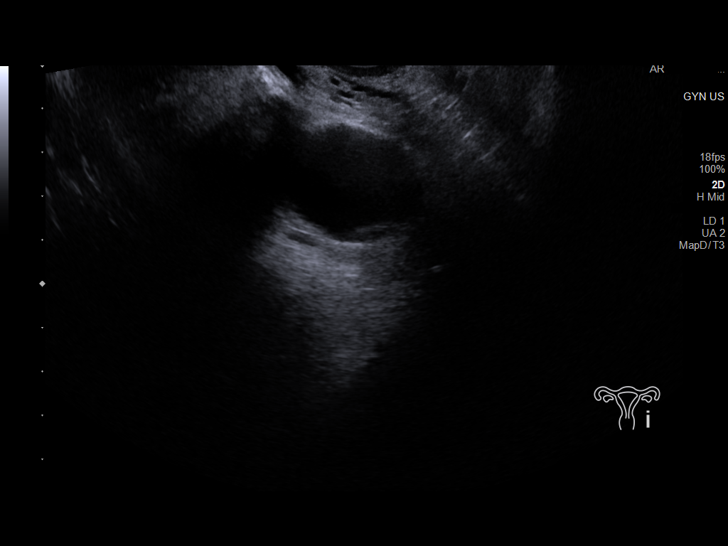
[im 91/91]
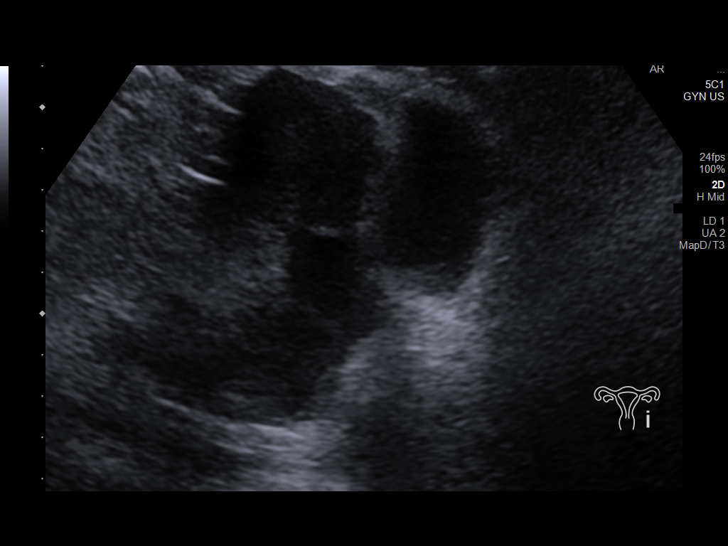

[13 of 25 positions shown; findings below may reference images not displayed]

FINDINGS: Uterus

Measurements: 8.1 x 4.1 x 4.2 cm = volume: 72.9 mL. 1 cm intramural
fibroid present at the left uterine fundus.

Endometrium

Thickness: 3 mm.  No focal abnormality visualized.

Right ovary

Measurements: 2.2 x 2.7 x 2.0 cm = volume: 4.9 mL. Normal
appearance/no adnexal mass.

Left ovary

Measurements: 6.2 x 3.4 x 6.2 cm = volume: 66.9 mL. Complex
hypoechoic cystic lesion measuring up to approximately up to
approximately 5 cm with internal lace-like architecture, most
characteristic of a hemorrhagic cyst. Adjacent simple anechoic cyst
measures up to 3.4 cm. Scattered areas of vascularity surrounding
these lesions favored to reflect to lie within adjacent ovarian
tissue. No definite internal solid component.

Pulsed Doppler evaluation of both ovaries demonstrates normal
low-resistance arterial and venous waveforms.

Other findings

Small volume free fluid within the pelvis.
IMPRESSION: 1. Approximate 5 cm complex left ovarian cyst, most characteristic
of a hemorrhagic cyst. Although this is likely benign, given size, a
short interval follow-up ultrasound in 6-12 weeks to ensure
resolution is recommended.
2. Adjacent 3.4 cm simple left ovarian cyst, likely a normal
physiologic follicular cyst. This could also be assessed at
follow-up.
3. Small volume free fluid within the pelvis.
4. No evidence for ovarian torsion.
5. 1 cm intramural fibroid at the uterine fundus.

## 2023-10-01 ENCOUNTER — Encounter (INDEPENDENT_AMBULATORY_CARE_PROVIDER_SITE_OTHER): Payer: Self-pay | Admitting: *Deleted

## 2023-11-03 ENCOUNTER — Encounter (INDEPENDENT_AMBULATORY_CARE_PROVIDER_SITE_OTHER): Payer: Self-pay | Admitting: Gastroenterology

## 2023-11-03 ENCOUNTER — Ambulatory Visit (INDEPENDENT_AMBULATORY_CARE_PROVIDER_SITE_OTHER): Payer: Medicaid Other | Admitting: Gastroenterology

## 2023-11-03 ENCOUNTER — Telehealth (INDEPENDENT_AMBULATORY_CARE_PROVIDER_SITE_OTHER): Payer: Self-pay | Admitting: Gastroenterology

## 2023-11-03 ENCOUNTER — Other Ambulatory Visit (HOSPITAL_COMMUNITY)
Admission: RE | Admit: 2023-11-03 | Discharge: 2023-11-03 | Disposition: A | Discharge status: Home / Self Care | Payer: Medicaid Other | Source: Ambulatory Visit | Attending: Gastroenterology | Admitting: Gastroenterology

## 2023-11-03 VITALS — BP 113/79 | HR 92 | Temp 98.6°F | Ht 61.0 in | Wt 201.0 lb

## 2023-11-03 DIAGNOSIS — K909 Intestinal malabsorption, unspecified: Secondary | ICD-10-CM | POA: Insufficient documentation

## 2023-11-03 DIAGNOSIS — K921 Melena: Secondary | ICD-10-CM | POA: Insufficient documentation

## 2023-11-03 DIAGNOSIS — Z6838 Body mass index (BMI) 38.0-38.9, adult: Secondary | ICD-10-CM | POA: Diagnosis not present

## 2023-11-03 DIAGNOSIS — R197 Diarrhea, unspecified: Secondary | ICD-10-CM | POA: Insufficient documentation

## 2023-11-03 DIAGNOSIS — K529 Noninfective gastroenteritis and colitis, unspecified: Secondary | ICD-10-CM | POA: Diagnosis not present

## 2023-11-03 LAB — BASIC METABOLIC PANEL
Anion gap: 9 (ref 5–15)
BUN: 11 mg/dL (ref 6–20)
CO2: 27 mmol/L (ref 22–32)
Calcium: 9.3 mg/dL (ref 8.9–10.3)
Chloride: 100 mmol/L (ref 98–111)
Creatinine, Ser: 0.61 mg/dL (ref 0.44–1.00)
GFR, Estimated: >60 mL/min (ref >60)
Glucose, Bld: 107 mg/dL — ABNORMAL HIGH (ref 70–99)
Potassium: 3.8 mmol/L (ref 3.5–5.1)
Sodium: 136 mmol/L (ref 135–145)

## 2023-11-03 LAB — FERRITIN: Ferritin: 66 ng/mL (ref 11–307)

## 2023-11-03 LAB — C-REACTIVE PROTEIN: CRP: 1.3 mg/dL — ABNORMAL HIGH (ref <1.0)

## 2023-11-03 MED ORDER — PEG 3350-KCL-NA BICARB-NACL 420 G PO SOLR: 4000.0000 mL | Freq: Once | ORAL | 0 refills | Status: AC

## 2023-11-03 NOTE — H&P (View-Only) (Signed)
 Vista Lawman , M.D. Gastroenterology & Hepatology [facility] [address] [place], [place] [address] Primary Care Physician: Toma Deiters, MD [address] [place] [address]  Chief Complaint:  painless hematochezia and chronic diarrhea  History of Present Illness: Rachael Dixon is a 38 y.o. female with AUD s/p hysterectomy , hypertension hyperlipidemia who presents for evaluation of new onset painless hematochezia and chronic diarrhea  Patient reports that in November 2024 she noticed blood upon wiping and also mixed with stool.  Patient denies straining.  She reports having 7-10 bowel movement per day for past 17 years denies any sense of incomplete evacuation or any hard stools.  Patient denies any abdominal pain .The patient denies having any nausea, vomiting, fever, chills,  melena, hematemesis, abdominal distention, abdominal pain, jaundice, pruritus or weight loss.  Last BMW:UXLK Last Colonoscopy:none  FHx: neg for any gastrointestinal/liver disease, no malignancies Social: neg smoking, alcohol or illicit drug use Surgical: Hysterectomy 12/2022  March 2024 labs hemoglobin 13 had a white count of 17 platelet 302  Past Medical History: Past Medical History:  Diagnosis Date   High cholesterol    Hypertension     Past Surgical History: Past Surgical History:  Procedure Laterality Date   ABDOMINAL HYSTERECTOMY      Family History: Family History  Problem Relation Age of Onset   Diabetes Father    Seizures Father    High Cholesterol Father    Hypertension Father    Diabetes Maternal Grandmother     Social History: Social History   Tobacco Use  Smoking Status Every Day   Current packs/day: 1.00   Types: Cigarettes   Passive exposure: Current  Smokeless Tobacco Never   Social History   Substance and Sexual Activity  Alcohol Use Yes   Alcohol/week: 4.0 standard drinks of alcohol   Types: 4  Shots of liquor per week   Comment: four per year   Social History   Substance and Sexual Activity  Drug Use Never    Allergies: Allergies  Allergen Reactions   Latex     Medications: Current Outpatient Medications  Medication Sig Dispense Refill   acetaminophen (TYLENOL) 500 MG tablet Take 500 mg by mouth every 6 (six) hours as needed.     bisoprolol-hydrochlorothiazide (ZIAC) 10-6.25 MG tablet Take 1 tablet by mouth daily.     fenofibrate (TRICOR) 145 MG tablet Take 145 mg by mouth daily.     naproxen sodium (ALEVE) 220 MG tablet Take 220 mg by mouth. Takes as needed - rarely takes     phentermine (ADIPEX-P) 37.5 MG tablet Take 37.5 mg by mouth daily before breakfast.     No current facility-administered medications for this visit.    Review of Systems: GENERAL: negative for malaise, night sweats HEENT: No changes in hearing or vision, no nose bleeds or other nasal problems. NECK: Negative for lumps, goiter, pain and significant neck swelling RESPIRATORY: Negative for cough, wheezing CARDIOVASCULAR: Negative for chest pain, leg swelling, palpitations, orthopnea GI: SEE HPI MUSCULOSKELETAL: Negative for joint pain or swelling, back pain, and muscle pain. SKIN: Negative for lesions, rash HEMATOLOGY Negative for prolonged bleeding, bruising easily, and swollen nodes. ENDOCRINE: Negative for cold or heat intolerance, polyuria, polydipsia and goiter. NEURO: negative for tremor, gait imbalance, syncope and seizures. The remainder of the review of systems is noncontributory.   Physical Exam: BP 113/79   Pulse 92   Temp 98.6 F (37 C) (Oral)   Ht  5\' 1"  (1.549 m)   Wt 201 lb (91.2 kg)   LMP 02/12/2020   BMI 37.98 kg/m  GENERAL: The patient is AO x3, in no acute distress. HEENT: Head is normocephalic and atraumatic. EOMI are intact. Mouth is well hydrated and without lesions. NECK: Supple. No masses LUNGS: Clear to auscultation. No presence of rhonchi/wheezing/rales.  Adequate chest expansion HEART: RRR, normal s1 and s2. ABDOMEN: Soft, nontender, no guarding, no peritoneal signs, and nondistended. BS +. No masses.   Imaging/Labs: as above     Latest Ref Rng & Units 10/09/2020   12:20 PM 01/11/2020   10:13 AM 07/25/2019    4:01 PM  CBC  WBC 4.0 - 10.5 K/uL 10.6  10.4  12.3   Hemoglobin 12.0 - 15.0 g/dL 16.1  09.6  04.5   Hematocrit 36.0 - 46.0 % 41.8  40.0  43.4   Platelets 150 - 400 K/uL 334  257  268    No results found for: "IRON", "TIBC", "FERRITIN"  I personally reviewed and interpreted the available labs, imaging and endoscopic files.  Impression and Plan:  Rachael Dixon is a 38 y.o. female with AUD s/p hysterectomy , hypertension hyperlipidemia who presents for evaluation of new onset painless hematochezia and chronic diarrhea  #Painless Hematochezia #Chronic diarrhea  This could be hemorrhoidal bleed but given chronic diarrhea young age with new onset painless hematochezia inflammatory bowel disease needs to be excluded. Hematochezia regardless of age is considered an alarm symptom and diagnostic colonoscopy is indicated  This is not a case of IBS-D as abdominal pain is not one of the complaints.  Will plan for diagnostic ileocolonoscopy to rule out inflammatory bowel disease Given chronic diarrhea will send CRP, fecal calprotectin and celiac serologies  If above workup is negative we will treat patient for hemorrhoids advised elevating diet with high-fiber diet  #BMI 38   Recommendation :     - walking at a brisk pace/biking at moderate intesity 2.5-5 hours per week     - use pedometer/step counter to track activity     - goal to lose 5-10% of initial body weight     - avoid suagry drinks and juices, use zero calorie beverages     - increase water intake     - eat a low carb diet with plenty of veggies and fruit     - Get sufficient sleep 7-8 hrs nightly     - maitain active lifestyle     - avoid alcohol     -  recommend 2-3 cups Coffee daily     - Counsel on lowering cholesterol by having a diet rich in vegetables,          protein (avoid red meats) and good fats(fish, salmon).   All questions were answered.      Vista Lawman, MD Gastroenterology and Hepatology [facility]   This chart has been completed using [facility] Dictation software, and while attempts have been made to ensure accuracy , certain words and phrases may not be transcribed as intended

## 2023-11-03 NOTE — Progress Notes (Signed)
Vista Lawman , M.D. Gastroenterology & Hepatology [facility] [address] [place], [place] [address] Primary Care Physician: Toma Deiters, MD [address] [place] [address]  Chief Complaint:  painless hematochezia and chronic diarrhea  History of Present Illness: Rachael Dixon is a 38 y.o. female with AUD s/p hysterectomy , hypertension hyperlipidemia who presents for evaluation of new onset painless hematochezia and chronic diarrhea  Patient reports that in November 2024 she noticed blood upon wiping and also mixed with stool.  Patient denies straining.  She reports having 7-10 bowel movement per day for past 17 years denies any sense of incomplete evacuation or any hard stools.  Patient denies any abdominal pain .The patient denies having any nausea, vomiting, fever, chills,  melena, hematemesis, abdominal distention, abdominal pain, jaundice, pruritus or weight loss.  Last BMW:UXLK Last Colonoscopy:none  FHx: neg for any gastrointestinal/liver disease, no malignancies Social: neg smoking, alcohol or illicit drug use Surgical: Hysterectomy 12/2022  March 2024 labs hemoglobin 13 had a white count of 17 platelet 302  Past Medical History: Past Medical History:  Diagnosis Date   High cholesterol    Hypertension     Past Surgical History: Past Surgical History:  Procedure Laterality Date   ABDOMINAL HYSTERECTOMY      Family History: Family History  Problem Relation Age of Onset   Diabetes Father    Seizures Father    High Cholesterol Father    Hypertension Father    Diabetes Maternal Grandmother     Social History: Social History   Tobacco Use  Smoking Status Every Day   Current packs/day: 1.00   Types: Cigarettes   Passive exposure: Current  Smokeless Tobacco Never   Social History   Substance and Sexual Activity  Alcohol Use Yes   Alcohol/week: 4.0 standard drinks of alcohol   Types: 4  Shots of liquor per week   Comment: four per year   Social History   Substance and Sexual Activity  Drug Use Never    Allergies: Allergies  Allergen Reactions   Latex     Medications: Current Outpatient Medications  Medication Sig Dispense Refill   acetaminophen (TYLENOL) 500 MG tablet Take 500 mg by mouth every 6 (six) hours as needed.     bisoprolol-hydrochlorothiazide (ZIAC) 10-6.25 MG tablet Take 1 tablet by mouth daily.     fenofibrate (TRICOR) 145 MG tablet Take 145 mg by mouth daily.     naproxen sodium (ALEVE) 220 MG tablet Take 220 mg by mouth. Takes as needed - rarely takes     phentermine (ADIPEX-P) 37.5 MG tablet Take 37.5 mg by mouth daily before breakfast.     No current facility-administered medications for this visit.    Review of Systems: GENERAL: negative for malaise, night sweats HEENT: No changes in hearing or vision, no nose bleeds or other nasal problems. NECK: Negative for lumps, goiter, pain and significant neck swelling RESPIRATORY: Negative for cough, wheezing CARDIOVASCULAR: Negative for chest pain, leg swelling, palpitations, orthopnea GI: SEE HPI MUSCULOSKELETAL: Negative for joint pain or swelling, back pain, and muscle pain. SKIN: Negative for lesions, rash HEMATOLOGY Negative for prolonged bleeding, bruising easily, and swollen nodes. ENDOCRINE: Negative for cold or heat intolerance, polyuria, polydipsia and goiter. NEURO: negative for tremor, gait imbalance, syncope and seizures. The remainder of the review of systems is noncontributory.   Physical Exam: BP 113/79   Pulse 92   Temp 98.6 F (37 C) (Oral)   Ht  5\' 1"  (1.549 m)   Wt 201 lb (91.2 kg)   LMP 02/12/2020   BMI 37.98 kg/m  GENERAL: The patient is AO x3, in no acute distress. HEENT: Head is normocephalic and atraumatic. EOMI are intact. Mouth is well hydrated and without lesions. NECK: Supple. No masses LUNGS: Clear to auscultation. No presence of rhonchi/wheezing/rales.  Adequate chest expansion HEART: RRR, normal s1 and s2. ABDOMEN: Soft, nontender, no guarding, no peritoneal signs, and nondistended. BS +. No masses.   Imaging/Labs: as above     Latest Ref Rng & Units 10/09/2020   12:20 PM 01/11/2020   10:13 AM 07/25/2019    4:01 PM  CBC  WBC 4.0 - 10.5 K/uL 10.6  10.4  12.3   Hemoglobin 12.0 - 15.0 g/dL 16.1  09.6  04.5   Hematocrit 36.0 - 46.0 % 41.8  40.0  43.4   Platelets 150 - 400 K/uL 334  257  268    No results found for: "IRON", "TIBC", "FERRITIN"  I personally reviewed and interpreted the available labs, imaging and endoscopic files.  Impression and Plan:  Rachael Dixon is a 38 y.o. female with AUD s/p hysterectomy , hypertension hyperlipidemia who presents for evaluation of new onset painless hematochezia and chronic diarrhea  #Painless Hematochezia #Chronic diarrhea  This could be hemorrhoidal bleed but given chronic diarrhea young age with new onset painless hematochezia inflammatory bowel disease needs to be excluded. Hematochezia regardless of age is considered an alarm symptom and diagnostic colonoscopy is indicated  This is not a case of IBS-D as abdominal pain is not one of the complaints.  Will plan for diagnostic ileocolonoscopy to rule out inflammatory bowel disease Given chronic diarrhea will send CRP, fecal calprotectin and celiac serologies  If above workup is negative we will treat patient for hemorrhoids advised elevating diet with high-fiber diet  #BMI 38   Recommendation :     - walking at a brisk pace/biking at moderate intesity 2.5-5 hours per week     - use pedometer/step counter to track activity     - goal to lose 5-10% of initial body weight     - avoid suagry drinks and juices, use zero calorie beverages     - increase water intake     - eat a low carb diet with plenty of veggies and fruit     - Get sufficient sleep 7-8 hrs nightly     - maitain active lifestyle     - avoid alcohol     -  recommend 2-3 cups Coffee daily     - Counsel on lowering cholesterol by having a diet rich in vegetables,          protein (avoid red meats) and good fats(fish, salmon).   All questions were answered.      Vista Lawman, MD Gastroenterology and Hepatology [facility]   This chart has been completed using [facility] Dictation software, and while attempts have been made to ensure accuracy , certain words and phrases may not be transcribed as intended

## 2023-11-03 NOTE — Telephone Encounter (Signed)
PA pending via [employer] for TCS

## 2023-11-03 NOTE — Patient Instructions (Signed)
It was very nice to meet you today, as dicussed with will plan for the following :  1) Lab work and stool sample  2) Colonoscopy   3) Ensure adequate fluid intake: Aim for 8 glasses of water daily. Follow a high fiber diet: Include foods such as dates, prunes, pears, and kiwi.

## 2023-11-04 LAB — TISSUE TRANSGLUTAMINASE, IGA: Tissue Transglutaminase Ab, IgA: <2 U/mL (ref 0–3)

## 2023-11-04 LAB — GLIADIN ANTIBODIES, SERUM
Antigliadin Abs, IgA: 3 U (ref 0–19)
Gliadin IgG: 2 U (ref 0–19)

## 2023-11-04 LAB — IGA: IgA: 110 mg/dL (ref 87–352)

## 2023-11-05 LAB — RETICULIN ANTIBODIES, IGA W TITER: Reticulin Ab, IgA: NEGATIVE {titer} (ref <2.5)

## 2023-11-05 NOTE — Progress Notes (Signed)
Hi Rachael Dixon ,  Can you please call the patient and tell the patient the lab work shows no evidence of celiac disease, normal iron level.  This is good news  Although given blood in stool would recommend colonoscopy as scheduled  Thanks,  Vista Lawman, MD Gastroenterology and Hepatology [facility]

## 2023-11-06 ENCOUNTER — Other Ambulatory Visit (HOSPITAL_COMMUNITY)
Admission: RE | Admit: 2023-11-06 | Discharge: 2023-11-06 | Disposition: A | Discharge status: Home / Self Care | Payer: Medicaid Other | Source: Ambulatory Visit | Attending: Gastroenterology | Admitting: Gastroenterology

## 2023-11-06 DIAGNOSIS — K921 Melena: Secondary | ICD-10-CM | POA: Insufficient documentation

## 2023-11-08 LAB — CALPROTECTIN, FECAL: Calprotectin, Fecal: <5 ug/g (ref 0–120)

## 2023-11-16 ENCOUNTER — Ambulatory Visit (HOSPITAL_BASED_OUTPATIENT_CLINIC_OR_DEPARTMENT_OTHER): Payer: Medicaid Other | Admitting: Anesthesiology

## 2023-11-16 ENCOUNTER — Other Ambulatory Visit: Payer: Self-pay

## 2023-11-16 ENCOUNTER — Encounter (HOSPITAL_COMMUNITY): Payer: Self-pay | Admitting: Gastroenterology

## 2023-11-16 ENCOUNTER — Ambulatory Visit (HOSPITAL_COMMUNITY)
Admission: RE | Admit: 2023-11-16 | Discharge: 2023-11-16 | Disposition: A | Discharge status: Home / Self Care | Payer: Medicaid Other | Attending: Gastroenterology | Admitting: Gastroenterology

## 2023-11-16 ENCOUNTER — Ambulatory Visit (HOSPITAL_COMMUNITY): Payer: Medicaid Other | Admitting: Anesthesiology

## 2023-11-16 ENCOUNTER — Encounter (HOSPITAL_COMMUNITY)
Admission: RE | Disposition: A | Discharge status: Home / Self Care | Payer: Self-pay | Source: Home / Self Care | Attending: Gastroenterology

## 2023-11-16 DIAGNOSIS — K529 Noninfective gastroenteritis and colitis, unspecified: Secondary | ICD-10-CM | POA: Insufficient documentation

## 2023-11-16 DIAGNOSIS — K640 First degree hemorrhoids: Secondary | ICD-10-CM

## 2023-11-16 DIAGNOSIS — D175 Benign lipomatous neoplasm of intra-abdominal organs: Secondary | ICD-10-CM

## 2023-11-16 DIAGNOSIS — Z8249 Family history of ischemic heart disease and other diseases of the circulatory system: Secondary | ICD-10-CM | POA: Insufficient documentation

## 2023-11-16 DIAGNOSIS — D1779 Benign lipomatous neoplasm of other sites: Secondary | ICD-10-CM | POA: Insufficient documentation

## 2023-11-16 DIAGNOSIS — K648 Other hemorrhoids: Secondary | ICD-10-CM | POA: Insufficient documentation

## 2023-11-16 DIAGNOSIS — I10 Essential (primary) hypertension: Secondary | ICD-10-CM | POA: Diagnosis not present

## 2023-11-16 DIAGNOSIS — F17210 Nicotine dependence, cigarettes, uncomplicated: Secondary | ICD-10-CM | POA: Diagnosis not present

## 2023-11-16 DIAGNOSIS — K6389 Other specified diseases of intestine: Secondary | ICD-10-CM | POA: Diagnosis not present

## 2023-11-16 DIAGNOSIS — K644 Residual hemorrhoidal skin tags: Secondary | ICD-10-CM | POA: Insufficient documentation

## 2023-11-16 DIAGNOSIS — K921 Melena: Secondary | ICD-10-CM | POA: Diagnosis present

## 2023-11-16 HISTORY — PX: COLONOSCOPY WITH PROPOFOL: SHX5780

## 2023-11-16 HISTORY — PX: BIOPSY: SHX5522

## 2023-11-16 LAB — HM COLONOSCOPY

## 2023-11-16 SURGERY — COLONOSCOPY WITH PROPOFOL
Anesthesia: General

## 2023-11-16 MED ORDER — SODIUM CHLORIDE 0.9% FLUSH: 3.0000 mL | INTRAVENOUS | Status: DC | PRN

## 2023-11-16 MED ORDER — PROPOFOL 10 MG/ML IV BOLUS
INTRAVENOUS | Status: DC | PRN
  Administered 2023-11-16: 50 mg via INTRAVENOUS
  Administered 2023-11-16: 100 mg via INTRAVENOUS

## 2023-11-16 MED ORDER — SODIUM CHLORIDE 0.9% FLUSH: 3.0000 mL | Freq: Two times a day (BID) | INTRAVENOUS | Status: DC

## 2023-11-16 MED ORDER — MIDAZOLAM HCL 2 MG/2ML IJ SOLN
INTRAMUSCULAR | Status: DC | PRN
  Administered 2023-11-16: 2 mg via INTRAVENOUS

## 2023-11-16 MED ORDER — PROPOFOL 500 MG/50ML IV EMUL
INTRAVENOUS | Status: DC | PRN
  Administered 2023-11-16: 150 ug/kg/min via INTRAVENOUS

## 2023-11-16 MED ORDER — LACTATED RINGERS IV SOLN: INTRAVENOUS | Status: DC | PRN

## 2023-11-16 MED ORDER — LIDOCAINE HCL (PF) 2 % IJ SOLN
INTRAMUSCULAR | Status: DC | PRN
  Administered 2023-11-16: 50 mg via INTRADERMAL

## 2023-11-16 MED ORDER — MIDAZOLAM HCL 2 MG/2ML IJ SOLN
INTRAMUSCULAR | Status: AC
  Filled 2023-11-16: qty 2

## 2023-11-16 NOTE — Anesthesia Postprocedure Evaluation (Signed)
Anesthesia Post Note  Patient: Rachael Dixon  Procedure(s) Performed: COLONOSCOPY WITH PROPOFOL BIOPSY  Patient location during evaluation: PACU Anesthesia Type: General Level of consciousness: awake and alert Pain management: pain level controlled Vital Signs Assessment: post-procedure vital signs reviewed and stable Respiratory status: spontaneous breathing, nonlabored ventilation, respiratory function stable and patient connected to nasal cannula oxygen Cardiovascular status: blood pressure returned to baseline and stable Postop Assessment: no apparent nausea or vomiting Anesthetic complications: no   There were no known notable events for this encounter.   Last Vitals:  Vitals:   11/16/23 0719 11/16/23 0833  BP: 105/64 97/61  Pulse: 68 76  Resp: 16 19  Temp: 36.8 C (!) 36.4 C  SpO2: 96% 95%    Last Pain:  Vitals:   11/16/23 0833  TempSrc: Oral  PainSc: 0-No pain                 Jaiyden Laur L Odile Veloso

## 2023-11-16 NOTE — Op Note (Signed)
[facility] Patient Name: Rachael Dixon Procedure Date: 11/16/2023 8:04 AM MRN: [id number] Date of Birth: [birth year] Attending MD: Sanjuan Dame , MD, [id number] CSN: [id number] Age: 38 Admit Type: Outpatient Procedure:                Colonoscopy Indications:              Chronic diarrhea, Hematochezia Providers:                Sanjuan Dame, MD, Francoise Ceo RN, RN, Judeth Cornfield.                            Jessee Avers, Technician Referring MD:              Medicines:                Monitored Anesthesia Care Complications:            No immediate complications. Estimated Blood Loss:     Estimated blood loss: none. Procedure:                Pre-Anesthesia Assessment:                           - Prior to the procedure, a History and Physical                            was performed, and patient medications and                            allergies were reviewed. The patient's tolerance of                            previous anesthesia was also reviewed. The risks                            and benefits of the procedure and the sedation                            options and risks were discussed with the patient.                            All questions were answered, and informed consent                            was obtained. Prior Anticoagulants: The patient has                            taken no anticoagulant or antiplatelet agents. ASA                            Grade Assessment: II - A patient with mild systemic                            disease. After reviewing the risks and benefits,  the patient was deemed in satisfactory condition to                            undergo the procedure.                           After obtaining informed consent, the colonoscope                            was passed under direct vision. Throughout the                            procedure, the patient's blood pressure, pulse, and                            oxygen  saturations were monitored continuously. The                            [id number]-HQ190L ([id number]) scope was introduced through                            the anus and advanced to the the terminal ileum.                            The colonoscopy was performed without difficulty.                            The patient tolerated the procedure well. The                            quality of the bowel preparation was evaluated                            using the BBPS [facility] Bowel Preparation Scale)                            with scores of: Right Colon = 3, Transverse Colon =                            3 and Left Colon = 3 (entire mucosa seen well with                            no residual staining, small fragments of stool or                            opaque liquid). The total BBPS score equals 9. The                            terminal ileum, ileocecal valve, appendiceal                            orifice, and rectum were photographed. Scope In: 8:14:49 AM Scope Out: 8:29:02 AM Scope Withdrawal Time: 0 hours 11  minutes 51 seconds  Total Procedure Duration: 0 hours 14 minutes 13 seconds  Findings:      The perianal and digital rectal examinations were normal.      There is no endoscopic evidence of erythema or inflammation in the       entire colon. Biopsies for histology were taken with a cold forceps for       evaluation of microscopic colitis.      An area of mucosa in the proximal ileum was nodular. Biopsies were taken       with a cold forceps for histology.      Non-bleeding external and internal hemorrhoids were found during       retroflexion. The hemorrhoids were small.      There was a small lipoma, in the ascending colon. Impression:               - Nodular ileal mucosa. Biopsied.                           - Non-bleeding external and internal hemorrhoids.                           - Small lipoma in the ascending colon. Moderate Sedation:      Per Anesthesia Care Recommendation:            - Patient has a contact number available for                            emergencies. The signs and symptoms of potential                            delayed complications were discussed with the                            patient. Return to normal activities tomorrow.                            Written discharge instructions were provided to the                            patient.                           - Resume previous diet.                           - Continue present medications.                           - Await pathology results.                           - Repeat colonoscopy in 10 years for screening                            purposes.                           - Return to GI clinic  as previously scheduled. Procedure Code(s):        --- Professional ---                           [id number], Colonoscopy, flexible; with biopsy, single                            or multiple Diagnosis Code(s):        --- Professional ---                           K63.89, Other specified diseases of intestine                           K64.8, Other hemorrhoids                           K52.9, Noninfective gastroenteritis and colitis,                            unspecified                           K92.1, Melena (includes Hematochezia) CPT copyright 2022 American Medical Association. All rights reserved. The codes documented in this report are preliminary and upon coder review may  be revised to meet current compliance requirements. Sanjuan Dame, MD Sanjuan Dame, MD 11/16/2023 8:48:37 AM This report has been signed electronically. Number of Addenda: 0

## 2023-11-16 NOTE — Anesthesia Procedure Notes (Signed)
Date/Time: 11/16/2023 8:10 AM  Performed by: Julian Reil, CRNAPre-anesthesia Checklist: Patient identified, Emergency Drugs available, Suction available and Patient being monitored Oxygen Delivery Method: Nasal cannula Induction Type: IV induction Placement Confirmation: positive ETCO2

## 2023-11-16 NOTE — Anesthesia Preprocedure Evaluation (Signed)
Anesthesia Evaluation  Patient identified by MRN, date of birth, ID band Patient awake    Reviewed: Allergy & Precautions, H&P , NPO status , Patient's Chart, lab work & pertinent test results, reviewed documented beta blocker date and time   Airway Mallampati: II  TM Distance: >3 FB Neck ROM: full    Dental no notable dental hx. (+) Dental Advisory Given, Teeth Intact   Pulmonary neg pulmonary ROS, Current Smoker   Pulmonary exam normal breath sounds clear to auscultation       Cardiovascular Exercise Tolerance: Good hypertension, negative cardio ROS Normal cardiovascular exam Rhythm:regular Rate:Normal     Neuro/Psych negative neurological ROS  negative psych ROS   GI/Hepatic negative GI ROS, Neg liver ROS,,,  Endo/Other  negative endocrine ROS    Renal/GU negative Renal ROS  negative genitourinary   Musculoskeletal   Abdominal   Peds  Hematology negative hematology ROS (+)   Anesthesia Other Findings   Reproductive/Obstetrics negative OB ROS                             Anesthesia Physical Anesthesia Plan  ASA: 2  Anesthesia Plan: General   Post-op Pain Management: Minimal or no pain anticipated   Induction: Intravenous  PONV Risk Score and Plan: Propofol infusion  Airway Management Planned: Nasal Cannula and Natural Airway  Additional Equipment: None  Intra-op Plan:   Post-operative Plan:   Informed Consent: I have reviewed the patients History and Physical, chart, labs and discussed the procedure including the risks, benefits and alternatives for the proposed anesthesia with the patient or authorized representative who has indicated his/her understanding and acceptance.     Dental Advisory Given  Plan Discussed with: CRNA  Anesthesia Plan Comments:        Anesthesia Quick Evaluation

## 2023-11-16 NOTE — Transfer of Care (Signed)
Immediate Anesthesia Transfer of Care Note  Patient: Rachael Dixon  Procedure(s) Performed: COLONOSCOPY WITH PROPOFOL BIOPSY  Patient Location: Endoscopy Unit  Anesthesia Type:General  Level of Consciousness: drowsy  Airway & Oxygen Therapy: Patient Spontanous Breathing  Post-op Assessment: Report given to RN and Post -op Vital signs reviewed and stable  Post vital signs: Reviewed and stable  Last Vitals:  Vitals Value Taken Time  BP    Temp    Pulse    Resp    SpO2      Last Pain:  Vitals:   11/16/23 0811  TempSrc:   PainSc: 0-No pain      Patients Stated Pain Goal: 6 (11/16/23 0719)  Complications: No notable events documented.

## 2023-11-16 NOTE — Discharge Instructions (Signed)

## 2023-11-16 NOTE — Interval H&P Note (Signed)
History and Physical Interval Note:  11/16/2023 7:28 AM  Rachael Dixon  has presented today for surgery, with the diagnosis of HEMATOCHEZIA, DIARRHEA.  The various methods of treatment have been discussed with the patient and family. After consideration of risks, benefits and other options for treatment, the patient has consented to  Procedure(s) with comments: COLONOSCOPY WITH PROPOFOL (N/A) - 8:15AM;ASA 1-2 as a surgical intervention.  The patient's history has been reviewed, patient examined, no change in status, stable for surgery.  I have reviewed the patient's chart and labs.  Questions were answered to the patient's satisfaction.     Juanetta Beets [place] Holian

## 2023-11-17 ENCOUNTER — Encounter (INDEPENDENT_AMBULATORY_CARE_PROVIDER_SITE_OTHER): Payer: Self-pay | Admitting: *Deleted

## 2023-11-17 ENCOUNTER — Encounter (HOSPITAL_COMMUNITY): Payer: Self-pay | Admitting: Gastroenterology

## 2023-11-17 LAB — SURGICAL PATHOLOGY

## 2023-11-20 ENCOUNTER — Encounter (INDEPENDENT_AMBULATORY_CARE_PROVIDER_SITE_OTHER): Payer: Self-pay | Admitting: *Deleted
